# Patient Record
Sex: Female | Born: 1955 | Race: White | Hispanic: No | Marital: Married | Smoking: Former smoker
Health system: Southern US, Community
[De-identification: ages and names within clinical notes are randomized; demographics above are authoritative.]

## PROBLEM LIST (undated history)

## (undated) DIAGNOSIS — E559 Vitamin D deficiency, unspecified: Secondary | ICD-10-CM

## (undated) DIAGNOSIS — E890 Postprocedural hypothyroidism: Secondary | ICD-10-CM

## (undated) DIAGNOSIS — F329 Major depressive disorder, single episode, unspecified: Secondary | ICD-10-CM

## (undated) DIAGNOSIS — R011 Cardiac murmur, unspecified: Secondary | ICD-10-CM

## (undated) DIAGNOSIS — R7303 Prediabetes: Secondary | ICD-10-CM

## (undated) DIAGNOSIS — T4145XA Adverse effect of unspecified anesthetic, initial encounter: Secondary | ICD-10-CM

## (undated) DIAGNOSIS — Z860100 Personal history of colon polyps, unspecified: Secondary | ICD-10-CM

## (undated) DIAGNOSIS — T8859XA Other complications of anesthesia, initial encounter: Secondary | ICD-10-CM

## (undated) DIAGNOSIS — H9193 Unspecified hearing loss, bilateral: Secondary | ICD-10-CM

## (undated) DIAGNOSIS — E7800 Pure hypercholesterolemia, unspecified: Secondary | ICD-10-CM

## (undated) DIAGNOSIS — D219 Benign neoplasm of connective and other soft tissue, unspecified: Secondary | ICD-10-CM

## (undated) DIAGNOSIS — N3281 Overactive bladder: Secondary | ICD-10-CM

## (undated) DIAGNOSIS — G4700 Insomnia, unspecified: Secondary | ICD-10-CM

## (undated) DIAGNOSIS — H9190 Unspecified hearing loss, unspecified ear: Secondary | ICD-10-CM

## (undated) DIAGNOSIS — E213 Hyperparathyroidism, unspecified: Secondary | ICD-10-CM

## (undated) DIAGNOSIS — F32A Depression, unspecified: Secondary | ICD-10-CM

## (undated) DIAGNOSIS — Z889 Allergy status to unspecified drugs, medicaments and biological substances status: Secondary | ICD-10-CM

## (undated) DIAGNOSIS — IMO0001 Reserved for inherently not codable concepts without codable children: Secondary | ICD-10-CM

## (undated) HISTORY — DX: Personal history of colonic polyps: Z86.010

## (undated) HISTORY — DX: Unspecified hearing loss, bilateral: H91.93

## (undated) HISTORY — PX: EYE SURGERY: SHX253

## (undated) HISTORY — DX: Pure hypercholesterolemia, unspecified: E78.00

## (undated) HISTORY — DX: Hyperparathyroidism, unspecified: E21.3

## (undated) HISTORY — PX: OTHER SURGICAL HISTORY: SHX169

## (undated) HISTORY — PX: BROW LIFT AND BLEPHAROPLASTY: SHX1271

## (undated) HISTORY — DX: Benign neoplasm of connective and other soft tissue, unspecified: D21.9

## (undated) HISTORY — DX: Postprocedural hypothyroidism: E89.0

## (undated) HISTORY — DX: Prediabetes: R73.03

## (undated) HISTORY — DX: Unspecified hearing loss, unspecified ear: H91.90

## (undated) HISTORY — PX: COLONOSCOPY: SHX5424

## (undated) HISTORY — DX: Personal history of colon polyps, unspecified: Z86.0100

## (undated) HISTORY — DX: Vitamin D deficiency, unspecified: E55.9

## (undated) HISTORY — PX: TONSILLECTOMY: SUR1361

## (undated) HISTORY — DX: Overactive bladder: N32.81

## (undated) HISTORY — DX: Reserved for inherently not codable concepts without codable children: IMO0001

## (undated) HISTORY — PX: WISDOM TOOTH EXTRACTION: SHX21

---

## 1998-04-28 ENCOUNTER — Other Ambulatory Visit: Admission: RE | Admit: 1998-04-28 | Discharge: 1998-04-28 | Payer: Self-pay | Admitting: Obstetrics and Gynecology

## 1999-12-06 ENCOUNTER — Ambulatory Visit (HOSPITAL_COMMUNITY): Admission: RE | Admit: 1999-12-06 | Discharge: 1999-12-06 | Payer: Self-pay | Admitting: Obstetrics and Gynecology

## 1999-12-06 ENCOUNTER — Encounter: Payer: Self-pay | Admitting: Obstetrics and Gynecology

## 2000-02-08 ENCOUNTER — Other Ambulatory Visit: Admission: RE | Admit: 2000-02-08 | Discharge: 2000-02-08 | Payer: Self-pay | Admitting: Obstetrics and Gynecology

## 2002-03-20 ENCOUNTER — Other Ambulatory Visit: Admission: RE | Admit: 2002-03-20 | Discharge: 2002-03-20 | Payer: Self-pay | Admitting: Obstetrics and Gynecology

## 2002-07-25 ENCOUNTER — Ambulatory Visit (HOSPITAL_COMMUNITY): Admission: RE | Admit: 2002-07-25 | Discharge: 2002-07-25 | Payer: Self-pay | Admitting: Obstetrics and Gynecology

## 2002-07-25 ENCOUNTER — Encounter: Payer: Self-pay | Admitting: Obstetrics and Gynecology

## 2003-03-26 ENCOUNTER — Other Ambulatory Visit: Admission: RE | Admit: 2003-03-26 | Discharge: 2003-03-26 | Payer: Self-pay | Admitting: Obstetrics and Gynecology

## 2005-08-03 ENCOUNTER — Encounter: Admission: RE | Admit: 2005-08-03 | Discharge: 2005-08-03 | Payer: Self-pay | Admitting: *Deleted

## 2006-01-13 ENCOUNTER — Other Ambulatory Visit: Admission: RE | Admit: 2006-01-13 | Discharge: 2006-01-13 | Payer: Self-pay | Admitting: Obstetrics and Gynecology

## 2006-01-23 ENCOUNTER — Ambulatory Visit (HOSPITAL_COMMUNITY): Admission: RE | Admit: 2006-01-23 | Discharge: 2006-01-23 | Payer: Self-pay | Admitting: Obstetrics and Gynecology

## 2006-02-02 ENCOUNTER — Encounter: Admission: RE | Admit: 2006-02-02 | Discharge: 2006-02-02 | Payer: Self-pay | Admitting: Obstetrics and Gynecology

## 2007-02-01 ENCOUNTER — Encounter: Admission: RE | Admit: 2007-02-01 | Discharge: 2007-02-01 | Payer: Self-pay | Admitting: Obstetrics and Gynecology

## 2008-03-27 ENCOUNTER — Encounter: Admission: RE | Admit: 2008-03-27 | Discharge: 2008-03-27 | Payer: Self-pay | Admitting: Obstetrics and Gynecology

## 2009-11-19 ENCOUNTER — Encounter: Admission: RE | Admit: 2009-11-19 | Discharge: 2009-11-19 | Payer: Self-pay | Admitting: Obstetrics and Gynecology

## 2011-05-26 ENCOUNTER — Ambulatory Visit
Admission: RE | Admit: 2011-05-26 | Discharge: 2011-05-26 | Disposition: A | Discharge status: Home / Self Care | Payer: Commercial Managed Care - PPO | Source: Ambulatory Visit | Attending: Family Medicine | Admitting: Family Medicine

## 2011-05-26 ENCOUNTER — Other Ambulatory Visit: Payer: Self-pay | Admitting: Family Medicine

## 2011-05-26 DIAGNOSIS — R609 Edema, unspecified: Secondary | ICD-10-CM

## 2011-05-26 DIAGNOSIS — R52 Pain, unspecified: Secondary | ICD-10-CM

## 2011-11-25 ENCOUNTER — Other Ambulatory Visit: Payer: Self-pay | Admitting: Obstetrics and Gynecology

## 2011-11-25 DIAGNOSIS — Z1231 Encounter for screening mammogram for malignant neoplasm of breast: Secondary | ICD-10-CM

## 2011-12-06 ENCOUNTER — Ambulatory Visit
Admission: RE | Admit: 2011-12-06 | Discharge: 2011-12-06 | Disposition: A | Discharge status: Home / Self Care | Payer: Commercial Managed Care - PPO | Source: Ambulatory Visit | Attending: Obstetrics and Gynecology | Admitting: Obstetrics and Gynecology

## 2011-12-06 DIAGNOSIS — Z1231 Encounter for screening mammogram for malignant neoplasm of breast: Secondary | ICD-10-CM

## 2011-12-07 ENCOUNTER — Ambulatory Visit: Payer: Commercial Managed Care - PPO

## 2012-02-10 DIAGNOSIS — N3281 Overactive bladder: Secondary | ICD-10-CM | POA: Insufficient documentation

## 2012-02-10 DIAGNOSIS — E559 Vitamin D deficiency, unspecified: Secondary | ICD-10-CM | POA: Insufficient documentation

## 2012-02-10 DIAGNOSIS — H9190 Unspecified hearing loss, unspecified ear: Secondary | ICD-10-CM | POA: Insufficient documentation

## 2012-02-10 DIAGNOSIS — IMO0001 Reserved for inherently not codable concepts without codable children: Secondary | ICD-10-CM | POA: Insufficient documentation

## 2012-02-15 ENCOUNTER — Ambulatory Visit (INDEPENDENT_AMBULATORY_CARE_PROVIDER_SITE_OTHER): Payer: Commercial Managed Care - PPO | Admitting: Obstetrics and Gynecology

## 2012-02-15 ENCOUNTER — Encounter: Payer: Self-pay | Admitting: Obstetrics and Gynecology

## 2012-02-15 VITALS — BP 112/76 | Ht 63.0 in | Wt 175.0 lb

## 2012-02-15 DIAGNOSIS — Z01419 Encounter for gynecological examination (general) (routine) without abnormal findings: Secondary | ICD-10-CM

## 2012-02-15 NOTE — Progress Notes (Signed)
The patient is not taking hormone replacement therapy The patient  is not taking a Calcium supplement. Post-menopausal bleeding:no  Last Pap: was normal June  2012 Last mammogram: was normal May  2012 Last DEXA scan : T= 1.18 December 2010 Last colonoscopy:polyp due in 2014  Urinary symptoms: none Normal bowel movements: Yes Reports abuse at home: No:   Subjective:    [name] is a 56 y.o. female G1P1 who presents for annual exam.  The patient has no complaints today.   The following portions of the patient's history were reviewed and updated as appropriate: allergies, current medications, past family history, past medical history, past social history, past surgical history and problem list.  Review of Systems Pertinent items are noted in HPI. Gastrointestinal:No change in bowel habits, no abdominal pain, no rectal bleeding Genitourinary:negative for dysuria, frequency, hematuria, nocturia and urinary incontinence    Objective:     BP 112/76  Ht 5\' 3"  (1.6 m)  Wt 175 lb (79.379 kg)  BMI 31.00 kg/m2  LMP 07/24/2011  Weight:  Wt Readings from Last 1 Encounters:  02/15/12 175 lb (79.379 kg)     BMI: Body mass index is 31.00 kg/(m^2). General Appearance: Alert, appropriate appearance for age. No acute distress HEENT: Grossly normal Neck / Thyroid: Supple, no masses, nodes or enlargement Lungs: clear to auscultation bilaterally Back: No CVA tenderness Breast Exam: No masses or nodes.No dimpling, nipple retraction or discharge. Cardiovascular: Regular rate and rhythm. S1, S2, no murmur Gastrointestinal: Soft, non-tender, no masses or organomegaly Pelvic Exam: Vulva and vagina appear normal. Bimanual exam reveals normal uterus and adnexa. Rectovaginal: normal rectal, no masses Lymphatic Exam: Non-palpable nodes in neck, clavicular, axillary, or inguinal regions Skin: no rash or abnormalities Neurologic: Normal gait and speech, no tremor  Psychiatric: Alert and oriented,  appropriate affect.       Assessment:    Normal gyn exam    Plan:    Pap, mammogram   Follow-up:  for annual exam

## 2012-02-16 LAB — PAP IG W/ RFLX HPV ASCU

## 2012-11-21 ENCOUNTER — Other Ambulatory Visit: Payer: Self-pay | Admitting: Obstetrics and Gynecology

## 2012-11-21 ENCOUNTER — Other Ambulatory Visit: Payer: Self-pay

## 2012-11-21 DIAGNOSIS — Z1231 Encounter for screening mammogram for malignant neoplasm of breast: Secondary | ICD-10-CM

## 2012-12-24 ENCOUNTER — Ambulatory Visit
Admission: RE | Admit: 2012-12-24 | Discharge: 2012-12-24 | Disposition: A | Discharge status: Home / Self Care | Payer: Commercial Managed Care - PPO | Source: Ambulatory Visit

## 2012-12-24 DIAGNOSIS — Z1231 Encounter for screening mammogram for malignant neoplasm of breast: Secondary | ICD-10-CM

## 2014-05-12 ENCOUNTER — Encounter: Payer: Self-pay | Admitting: Obstetrics and Gynecology

## 2014-06-16 ENCOUNTER — Other Ambulatory Visit: Payer: Self-pay

## 2014-06-16 DIAGNOSIS — Z1231 Encounter for screening mammogram for malignant neoplasm of breast: Secondary | ICD-10-CM

## 2014-07-08 ENCOUNTER — Ambulatory Visit
Admission: RE | Admit: 2014-07-08 | Discharge: 2014-07-08 | Disposition: A | Discharge status: Home / Self Care | Payer: Commercial Managed Care - PPO | Source: Ambulatory Visit

## 2014-07-08 DIAGNOSIS — Z1231 Encounter for screening mammogram for malignant neoplasm of breast: Secondary | ICD-10-CM

## 2014-10-08 ENCOUNTER — Other Ambulatory Visit: Payer: Self-pay | Admitting: Otolaryngology

## 2014-10-08 DIAGNOSIS — H905 Unspecified sensorineural hearing loss: Secondary | ICD-10-CM

## 2014-10-30 ENCOUNTER — Ambulatory Visit
Admission: RE | Admit: 2014-10-30 | Discharge: 2014-10-30 | Disposition: A | Discharge status: Home / Self Care | Payer: Commercial Managed Care - PPO | Source: Ambulatory Visit | Attending: Otolaryngology | Admitting: Otolaryngology

## 2014-10-30 DIAGNOSIS — H905 Unspecified sensorineural hearing loss: Secondary | ICD-10-CM

## 2014-10-30 MED ORDER — IOPAMIDOL (ISOVUE-300) INJECTION 61%
75.0000 mL | Freq: Once | INTRAVENOUS | Status: AC | PRN
  Administered 2014-10-30: 75 mL via INTRAVENOUS

## 2014-12-15 ENCOUNTER — Ambulatory Visit
Admission: RE | Admit: 2014-12-15 | Discharge: 2014-12-15 | Disposition: A | Discharge status: Home / Self Care | Payer: Commercial Managed Care - PPO | Source: Ambulatory Visit | Attending: Family Medicine | Admitting: Family Medicine

## 2014-12-15 ENCOUNTER — Other Ambulatory Visit: Payer: Self-pay | Admitting: Family Medicine

## 2014-12-15 DIAGNOSIS — Z01818 Encounter for other preprocedural examination: Secondary | ICD-10-CM

## 2015-03-24 ENCOUNTER — Other Ambulatory Visit (HOSPITAL_COMMUNITY): Payer: Self-pay | Admitting: Endocrinology

## 2015-03-24 DIAGNOSIS — E0789 Other specified disorders of thyroid: Secondary | ICD-10-CM

## 2015-03-24 DIAGNOSIS — E213 Hyperparathyroidism, unspecified: Secondary | ICD-10-CM

## 2015-03-31 ENCOUNTER — Ambulatory Visit (HOSPITAL_COMMUNITY)
Admission: RE | Admit: 2015-03-31 | Discharge: 2015-03-31 | Disposition: A | Discharge status: Home / Self Care | Payer: Commercial Managed Care - PPO | Source: Ambulatory Visit | Attending: Endocrinology | Admitting: Endocrinology

## 2015-03-31 DIAGNOSIS — E049 Nontoxic goiter, unspecified: Secondary | ICD-10-CM | POA: Diagnosis present

## 2015-03-31 DIAGNOSIS — E0789 Other specified disorders of thyroid: Secondary | ICD-10-CM

## 2015-03-31 DIAGNOSIS — E042 Nontoxic multinodular goiter: Secondary | ICD-10-CM | POA: Insufficient documentation

## 2015-04-03 ENCOUNTER — Other Ambulatory Visit: Payer: Self-pay | Admitting: Endocrinology

## 2015-04-03 DIAGNOSIS — E041 Nontoxic single thyroid nodule: Secondary | ICD-10-CM

## 2015-04-07 ENCOUNTER — Ambulatory Visit
Admission: RE | Admit: 2015-04-07 | Discharge: 2015-04-07 | Disposition: A | Discharge status: Home / Self Care | Payer: Commercial Managed Care - PPO | Source: Ambulatory Visit | Attending: Endocrinology | Admitting: Endocrinology

## 2015-04-07 ENCOUNTER — Other Ambulatory Visit (HOSPITAL_COMMUNITY)
Admission: RE | Admit: 2015-04-07 | Discharge: 2015-04-07 | Disposition: A | Discharge status: Home / Self Care | Payer: Commercial Managed Care - PPO | Source: Ambulatory Visit | Attending: Physician Assistant | Admitting: Physician Assistant

## 2015-04-07 DIAGNOSIS — E042 Nontoxic multinodular goiter: Secondary | ICD-10-CM | POA: Insufficient documentation

## 2015-04-07 DIAGNOSIS — E041 Nontoxic single thyroid nodule: Secondary | ICD-10-CM

## 2015-04-07 NOTE — Procedures (Signed)
Using direct ultrasound guidance, 3 passes were made using needles into each nodule within the right and left lobe of the thyroid.   Ultrasound was used to confirm needle placements on all occasions.   Specimens were sent to Pathology for analysis.  WENDY S BLAIR PA-C 04/07/2015 10:25 AM

## 2015-04-08 ENCOUNTER — Encounter (HOSPITAL_COMMUNITY)
Admission: RE | Admit: 2015-04-08 | Discharge: 2015-04-08 | Disposition: A | Discharge status: Home / Self Care | Payer: Commercial Managed Care - PPO | Source: Ambulatory Visit | Attending: Endocrinology | Admitting: Endocrinology

## 2015-04-08 ENCOUNTER — Encounter (HOSPITAL_COMMUNITY): Payer: Commercial Managed Care - PPO

## 2015-04-08 MED ORDER — TECHNETIUM TC 99M TETROFOSMIN IV KIT
26.0000 | PACK | Freq: Once | INTRAVENOUS | Status: AC | PRN
  Administered 2015-04-08: 26 via INTRAVENOUS

## 2015-04-23 ENCOUNTER — Other Ambulatory Visit: Payer: Self-pay | Admitting: Endocrinology

## 2015-04-23 DIAGNOSIS — E049 Nontoxic goiter, unspecified: Secondary | ICD-10-CM

## 2015-05-20 ENCOUNTER — Ambulatory Visit: Payer: Self-pay | Admitting: Surgery

## 2015-06-11 DIAGNOSIS — E890 Postprocedural hypothyroidism: Secondary | ICD-10-CM

## 2015-06-11 DIAGNOSIS — E213 Hyperparathyroidism, unspecified: Secondary | ICD-10-CM

## 2015-06-11 HISTORY — DX: Hyperparathyroidism, unspecified: E21.3

## 2015-06-11 HISTORY — DX: Postprocedural hypothyroidism: E89.0

## 2015-06-16 ENCOUNTER — Encounter (HOSPITAL_COMMUNITY): Payer: Self-pay

## 2015-06-16 ENCOUNTER — Encounter (HOSPITAL_COMMUNITY)
Admission: RE | Admit: 2015-06-16 | Discharge: 2015-06-16 | Disposition: A | Discharge status: Home / Self Care | Payer: Commercial Managed Care - PPO | Source: Ambulatory Visit | Attending: Surgery | Admitting: Surgery

## 2015-06-16 DIAGNOSIS — E042 Nontoxic multinodular goiter: Secondary | ICD-10-CM | POA: Diagnosis not present

## 2015-06-16 DIAGNOSIS — M8580 Other specified disorders of bone density and structure, unspecified site: Secondary | ICD-10-CM | POA: Diagnosis not present

## 2015-06-16 DIAGNOSIS — Z79899 Other long term (current) drug therapy: Secondary | ICD-10-CM | POA: Diagnosis not present

## 2015-06-16 DIAGNOSIS — R011 Cardiac murmur, unspecified: Secondary | ICD-10-CM | POA: Diagnosis not present

## 2015-06-16 DIAGNOSIS — F329 Major depressive disorder, single episode, unspecified: Secondary | ICD-10-CM | POA: Diagnosis not present

## 2015-06-16 DIAGNOSIS — E559 Vitamin D deficiency, unspecified: Secondary | ICD-10-CM | POA: Diagnosis not present

## 2015-06-16 DIAGNOSIS — E210 Primary hyperparathyroidism: Secondary | ICD-10-CM | POA: Diagnosis present

## 2015-06-16 DIAGNOSIS — I10 Essential (primary) hypertension: Secondary | ICD-10-CM | POA: Diagnosis not present

## 2015-06-16 DIAGNOSIS — Z87891 Personal history of nicotine dependence: Secondary | ICD-10-CM | POA: Diagnosis not present

## 2015-06-16 DIAGNOSIS — E7800 Pure hypercholesterolemia, unspecified: Secondary | ICD-10-CM | POA: Diagnosis not present

## 2015-06-16 HISTORY — DX: Depression, unspecified: F32.A

## 2015-06-16 HISTORY — DX: Insomnia, unspecified: G47.00

## 2015-06-16 HISTORY — DX: Cardiac murmur, unspecified: R01.1

## 2015-06-16 HISTORY — DX: Major depressive disorder, single episode, unspecified: F32.9

## 2015-06-16 HISTORY — DX: Allergy status to unspecified drugs, medicaments and biological substances: Z88.9

## 2015-06-16 HISTORY — DX: Hypercalcemia: E83.52

## 2015-06-16 LAB — CBC
HEMATOCRIT: 44 % (ref 36.0–46.0)
Hemoglobin: 14.8 g/dL (ref 12.0–15.0)
MCH: 30 pg (ref 26.0–34.0)
MCHC: 33.6 g/dL (ref 30.0–36.0)
MCV: 89.2 fL (ref 78.0–100.0)
Platelets: 314 10*3/uL (ref 150–400)
RBC: 4.93 MIL/uL (ref 3.87–5.11)
RDW: 12.9 % (ref 11.5–15.5)
WBC: 6.8 10*3/uL (ref 4.0–10.5)

## 2015-06-16 NOTE — Patient Instructions (Addendum)
[place]  06/16/2015   Your procedure is scheduled on:   06-18-2015 Thursday  Enter through [place] and follow signs to [facility] to [place]. Arrive at  0530      AM .  (Limit 1 person with you).  Call this number if you have problems the morning of surgery: [phone]  Or Presurgical Testing [phone] days before.   For Living Will and/or Health Care Power Attorney Forms: please provide copy for your medical record,may bring AM of surgery(Forms should be already notarized -we do not provide this service).(06-16-15 Yes-to bring AM of.).      Do not eat food/ or drink: After Midnight.     Take these medicines the morning of surgery with A SIP OF WATER-   (DO NOT TAKE ANY DIABETIC MEDS AM OF SURGERY) : NONE.   Do not wear jewelry, make-up or nail polish.  Do not wear deodorant, lotions, powders, or perfumes.   Do not shave legs and under arms- 48 hours(2 days) prior to first CHG shower.(Shaving face and neck okay.)  Do not bring valuables to the hospital.(Hospital is not responsible for lost valuables).  Contacts, dentures or removable bridgework, body piercing, hair pins may not be worn into surgery.  Leave suitcase in the car. After surgery it may be brought to your room.  For patients admitted to the hospital, checkout time is 11:00 AM the day of discharge.(Restricted visitors-Any Persons displaying flu-like symptoms or illness).    Patients discharged the day of surgery will not be allowed to drive home. Must have responsible person with you x 24 hours once discharged.  Name and phone number of your driver: Roger-spouse [phone] cell     Please read over the following fact sheets that you were given:  CHG(Chlorhexidine Gluconate 4% Surgical Soap) use.  Remember : Type/Screen "Blue armbands" - may not be removed once applied(would result in being retested AM of surgery, if removed).         [facility] - Preparing  for Surgery Before surgery, you can play an important role.  Because skin is not sterile, your skin needs to be as free of germs as possible.  You can reduce the number of germs on your skin by washing with CHG (chlorahexidine gluconate) soap before surgery.  CHG is an antiseptic cleaner which kills germs and bonds with the skin to continue killing germs even after washing. Please DO NOT use if you have an allergy to CHG or antibacterial soaps.  If your skin becomes reddened/irritated stop using the CHG and inform your nurse when you arrive at Short Stay. Do not shave (including legs and underarms) for at least 48 hours prior to the first CHG shower.  You may shave your face/neck. Please follow these instructions carefully:  1.  Shower with CHG Soap the night before surgery and the  morning of Surgery.  2.  If you choose to wash your hair, wash your hair first as usual with your  normal  shampoo.  3.  After you shampoo, rinse your hair and body thoroughly to remove the  shampoo.                           4.  Use CHG as you would any other liquid soap.  You can apply chg directly  to the skin and wash  Gently with a scrungie or clean washcloth.  5.  Apply the CHG Soap to your body ONLY FROM THE NECK DOWN.   Do not use on face/ open                           Wound or open sores. Avoid contact with eyes, ears mouth and genitals (private parts).                       Wash face,  Genitals (private parts) with your normal soap.             6.  Wash thoroughly, paying special attention to the area where your surgery  will be performed.  7.  Thoroughly rinse your body with warm water from the neck down.  8.  DO NOT shower/wash with your normal soap after using and rinsing off  the CHG Soap.                9.  Pat yourself dry with a clean towel.            10.  Wear clean pajamas.            11.  Place clean sheets on your bed the night of your first shower and do not  sleep with  pets. Day of Surgery : Do not apply any lotions/deodorants the morning of surgery.  Please wear clean clothes to the hospital/surgery center.  FAILURE TO FOLLOW THESE INSTRUCTIONS MAY RESULT IN THE CANCELLATION OF YOUR SURGERY PATIENT SIGNATURE_________________________________  NURSE SIGNATURE__________________________________  ________________________________________________________________________

## 2015-06-17 ENCOUNTER — Encounter (HOSPITAL_COMMUNITY): Payer: Self-pay | Admitting: Surgery

## 2015-06-17 DIAGNOSIS — E210 Primary hyperparathyroidism: Secondary | ICD-10-CM | POA: Diagnosis present

## 2015-06-17 NOTE — H&P (Signed)
General Surgery [facility]  [name] DOB: 1956/01/17 Married / Language: English / Race: White Female  History of Present Illness The patient is a 59 year old female who presents with a parathyroid neoplasm.  Patient is referred by Dr. Jacelyn Pi for evaluation of primary hyperparathyroidism. Patient has a long-standing history of hypercalcemia. She has been treated for a number of years for vitamin D deficiency. She has had multiple bone density scans which documented osteopenia. Patient denies any other complications such as nephrolithiasis. She does have significant chronic fatigue. Unrelated to her parathyroid disease, the patient has significant hearing loss and wears bilateral hearing aids. She is considering cochlear implant surgery. Patient was referred by her primary care physician, Dr. Darcus Austin, to endocrinology for evaluation of hypercalcemia. Patient was also found to have bilateral thyroid nodules and underwent bilateral fine-needle aspiration biopsy with benign cytopathology. Patient had recent laboratory studies demonstrating an elevated calcium at 10.5 and an elevated intact PTH level at 74 with a normal vitamin D level of 32.6. Patient underwent nuclear medicine parathyroid scan on April 08, 2015. This showed a persistent focus of increased uptake at the right inferior thyroid lobe consistent with parathyroid adenoma. Sensitivity of this study may be limited by the presence of bilateral thyroid nodules. There was no corresponding anatomic lesion seen on thyroid ultrasound. Patient has had no prior head or neck surgery. There is a family history of possible parathyroid disease in the patient's mother at advanced age, but no operative intervention was performed. There is no other family history of endocrine neoplasms.  Other Problems Back Pain Depression Heart murmur High blood pressure Hypercholesterolemia  Past  Surgical History  Colon Polyp Removal - Colonoscopy Colon Polyp Removal - Open Oral Surgery Tonsillectomy  Diagnostic Studies History  Colonoscopy within last year Mammogram within last year Pap Smear 1-5 years ago  Allergies No Known Drug Allergies11/03/2015  Medication History Eszopiclone (3MG  Tablet, Oral) Active. Sertraline HCl (50MG  Tablet, Oral) Active. Vitamin D (Ergocalciferol) (50000UNIT Capsule, Oral two times a week) Active. Medications Reconciled  Social History Alcohol use Occasional alcohol use. Caffeine use Coffee, Tea. No drug use Tobacco use Former smoker.  Family History Alcohol Abuse Father, Sister. Breast Cancer Sister. Depression Mother, Sister. Diabetes Mellitus Mother. Hypertension Mother, Sister. Migraine Headache Mother.  Pregnancy / Birth History Age at menarche 59 years. Age of menopause 34-55 Gravida 1 Maternal age 19-30 Para 1  Review of Systems General Present- Fatigue and Weight Gain. Not Present- Appetite Loss, Chills, Fever, Night Sweats and Weight Loss. Skin Not Present- Change in Wart/Mole, Dryness, Hives, Jaundice, New Lesions, Non-Healing Wounds, Rash and Ulcer. HEENT Present- Hearing Loss. Not Present- Earache, Hoarseness, Nose Bleed, Oral Ulcers, Ringing in the Ears, Seasonal Allergies, Sinus Pain, Sore Throat, Visual Disturbances, Wears glasses/contact lenses and Yellow Eyes. Respiratory Present- Snoring. Not Present- Bloody sputum, Chronic Cough, Difficulty Breathing and Wheezing. Breast Not Present- Breast Mass, Breast Pain, Nipple Discharge and Skin Changes. Cardiovascular Present- Shortness of Breath and Swelling of Extremities. Not Present- Chest Pain, Difficulty Breathing Lying Down, Leg Cramps, Palpitations and Rapid Heart Rate. Gastrointestinal Not Present- Abdominal Pain, Bloating, Bloody Stool, Change in Bowel Habits, Chronic diarrhea, Constipation, Difficulty Swallowing, Excessive gas, Gets  full quickly at meals, Hemorrhoids, Indigestion, Nausea, Rectal Pain and Vomiting. Female Genitourinary Present- Urgency. Not Present- Frequency, Nocturia, Painful Urination and Pelvic Pain. Musculoskeletal Present- Back Pain, Joint Pain and Joint Stiffness. Not Present- Muscle Pain, Muscle Weakness and Swelling of Extremities. Neurological Present- Decreased  Memory. Not Present- Fainting, Headaches, Numbness, Seizures, Tingling, Tremor, Trouble walking and Weakness. Psychiatric Present- Depression. Not Present- Anxiety, Bipolar, Change in Sleep Pattern, Fearful and Frequent crying. Endocrine Present- Cold Intolerance, Excessive Hunger and Hair Changes. Not Present- Heat Intolerance, Hot flashes and New Diabetes. Hematology Not Present- Easy Bruising, Excessive bleeding, Gland problems, HIV and Persistent Infections.  Vitals Weight: 193 lb Height: 63in Body Surface Area: 1.9 m Body Mass Index: 34.19 kg/m  Temp.: 97.38F(Temporal)  Pulse: 81 (Regular)  BP: 128/72 (Sitting, Left Arm, Standard)  Physical Exam   General - appears comfortable, no distress; not diaphorectic  HEENT - normocephalic; sclerae clear, gaze conjugate; mucous membranes moist, dentition good; voice normal  Neck - asymmetric on extension; no palpable anterior or posterior cervical adenopathy; bilateral palpable thyroid nodules, 2-3 cm in size, smooth, mobile, nontender  Chest - clear bilaterally without rhonchi, rales, or wheeze  Cor - regular rhythm with normal rate; no significant murmur  Ext - non-tender without significant edema or lymphedema  Neuro - grossly intact; no tremor   Assessment & Plan  MULTIPLE THYROID NODULES (E04.2)  PRIMARY HYPERPARATHYROIDISM (E21.0)  Patient presents with signs and symptoms of primary hyperparathyroidism. Nuclear medicine parathyroid scan localizes a parathyroid adenoma to the right inferior position. Patient is provided with written literature on parathyroid  surgery to review at home.  I have recommended proceeding with minimally invasive parathyroidectomy targeting the right inferior parathyroid gland. However, I have cautioned the patient that the nuclear medicine scan may be somewhat less reliable due to the presence of bilateral thyroid nodules. We have discussed the possibility of conversion to 4 gland exploration requiring an overnight hospitalization. We discussed the risk and benefits of the procedure including the potential for recurrent laryngeal nerve injury. Patient understands and wishes to proceed with surgery in the near future.  The risks and benefits of the procedure have been discussed at length with the patient. The patient understands the proposed procedure, potential alternative treatments, and the course of recovery to be expected. All of the patient's questions have been answered at this time. The patient wishes to proceed with surgery.  Earnstine Regal, MD, [facility] Office: [phone]

## 2015-06-17 NOTE — Anesthesia Preprocedure Evaluation (Signed)
Anesthesia Evaluation  Patient identified by MRN, date of birth, ID band Patient awake    Reviewed: Allergy & Precautions, H&P , NPO status , Patient's Chart, lab work & pertinent test results  Airway Mallampati: II  TM Distance: >3 FB Neck ROM: full    Dental no notable dental hx. (+) Dental Advisory Given, Teeth Intact   Pulmonary neg pulmonary ROS, former smoker,    Pulmonary exam normal breath sounds clear to auscultation       Cardiovascular Exercise Tolerance: Good negative cardio ROS Normal cardiovascular exam Rhythm:regular Rate:Normal     Neuro/Psych negative neurological ROS  negative psych ROS   GI/Hepatic negative GI ROS, Neg liver ROS,   Endo/Other  negative endocrine ROS  Renal/GU negative Renal ROS  negative genitourinary   Musculoskeletal   Abdominal   Peds  Hematology negative hematology ROS (+)   Anesthesia Other Findings   Reproductive/Obstetrics negative OB ROS                             Anesthesia Physical Anesthesia Plan  ASA: II  Anesthesia Plan: General   Post-op Pain Management:    Induction: Intravenous  Airway Management Planned: Oral ETT  Additional Equipment:   Intra-op Plan:   Post-operative Plan: Extubation in OR  Informed Consent: I have reviewed the patients History and Physical, chart, labs and discussed the procedure including the risks, benefits and alternatives for the proposed anesthesia with the patient or authorized representative who has indicated his/her understanding and acceptance.   Dental Advisory Given  Plan Discussed with: CRNA and Surgeon  Anesthesia Plan Comments:         Anesthesia Quick Evaluation

## 2015-06-18 ENCOUNTER — Ambulatory Visit (HOSPITAL_COMMUNITY): Payer: Commercial Managed Care - PPO | Admitting: Anesthesiology

## 2015-06-18 ENCOUNTER — Encounter (HOSPITAL_COMMUNITY)
Admission: RE | Disposition: A | Discharge status: Home / Self Care | Payer: Self-pay | Source: Ambulatory Visit | Attending: Surgery

## 2015-06-18 ENCOUNTER — Observation Stay (HOSPITAL_COMMUNITY)
Admission: RE | Admit: 2015-06-18 | Discharge: 2015-06-19 | Disposition: A | Discharge status: Home / Self Care | Payer: Commercial Managed Care - PPO | Source: Ambulatory Visit | Attending: Surgery | Admitting: Surgery

## 2015-06-18 ENCOUNTER — Encounter (HOSPITAL_COMMUNITY): Payer: Self-pay | Admitting: *Deleted

## 2015-06-18 DIAGNOSIS — E7800 Pure hypercholesterolemia, unspecified: Secondary | ICD-10-CM | POA: Insufficient documentation

## 2015-06-18 DIAGNOSIS — R011 Cardiac murmur, unspecified: Secondary | ICD-10-CM | POA: Insufficient documentation

## 2015-06-18 DIAGNOSIS — Z79899 Other long term (current) drug therapy: Secondary | ICD-10-CM | POA: Insufficient documentation

## 2015-06-18 DIAGNOSIS — E041 Nontoxic single thyroid nodule: Secondary | ICD-10-CM | POA: Diagnosis present

## 2015-06-18 DIAGNOSIS — E042 Nontoxic multinodular goiter: Secondary | ICD-10-CM | POA: Insufficient documentation

## 2015-06-18 DIAGNOSIS — M8580 Other specified disorders of bone density and structure, unspecified site: Secondary | ICD-10-CM | POA: Insufficient documentation

## 2015-06-18 DIAGNOSIS — E210 Primary hyperparathyroidism: Principal | ICD-10-CM | POA: Diagnosis present

## 2015-06-18 DIAGNOSIS — E559 Vitamin D deficiency, unspecified: Secondary | ICD-10-CM | POA: Insufficient documentation

## 2015-06-18 DIAGNOSIS — I10 Essential (primary) hypertension: Secondary | ICD-10-CM | POA: Insufficient documentation

## 2015-06-18 DIAGNOSIS — F329 Major depressive disorder, single episode, unspecified: Secondary | ICD-10-CM | POA: Insufficient documentation

## 2015-06-18 DIAGNOSIS — Z87891 Personal history of nicotine dependence: Secondary | ICD-10-CM | POA: Insufficient documentation

## 2015-06-18 HISTORY — PX: PARATHYROIDECTOMY: SHX19

## 2015-06-18 HISTORY — PX: THYROIDECTOMY: SHX17

## 2015-06-18 SURGERY — PARATHYROIDECTOMY
Anesthesia: General | Site: Neck | Laterality: Right

## 2015-06-18 MED ORDER — HYDROMORPHONE HCL 1 MG/ML IJ SOLN
1.0000 mg | INTRAMUSCULAR | Status: DC | PRN
  Administered 2015-06-18 (×2): 1 mg via INTRAVENOUS
  Filled 2015-06-18 (×2): qty 1

## 2015-06-18 MED ORDER — ONDANSETRON HCL 4 MG/2ML IJ SOLN
INTRAMUSCULAR | Status: DC | PRN
  Administered 2015-06-18: 4 mg via INTRAVENOUS

## 2015-06-18 MED ORDER — PROPOFOL 10 MG/ML IV BOLUS
INTRAVENOUS | Status: DC | PRN
  Administered 2015-06-18: 170 mg via INTRAVENOUS
  Administered 2015-06-18: 30 mg via INTRAVENOUS

## 2015-06-18 MED ORDER — ACETAMINOPHEN 650 MG RE SUPP: 650.0000 mg | Freq: Four times a day (QID) | RECTAL | Status: DC | PRN

## 2015-06-18 MED ORDER — LACTATED RINGERS IV SOLN
INTRAVENOUS | Status: DC | PRN
  Administered 2015-06-18 (×2): via INTRAVENOUS

## 2015-06-18 MED ORDER — ACETAMINOPHEN 10 MG/ML IV SOLN
INTRAVENOUS | Status: AC
  Filled 2015-06-18: qty 100

## 2015-06-18 MED ORDER — LIDOCAINE HCL (CARDIAC) 20 MG/ML IV SOLN
INTRAVENOUS | Status: DC | PRN
  Administered 2015-06-18: 25 mg via INTRATRACHEAL
  Administered 2015-06-18: 75 mg via INTRAVENOUS

## 2015-06-18 MED ORDER — HYDRALAZINE HCL 20 MG/ML IJ SOLN
INTRAMUSCULAR | Status: DC | PRN
  Administered 2015-06-18: 5 mg via INTRAVENOUS

## 2015-06-18 MED ORDER — BUPIVACAINE-EPINEPHRINE (PF) 0.25% -1:200000 IJ SOLN
INTRAMUSCULAR | Status: AC
  Filled 2015-06-18: qty 30

## 2015-06-18 MED ORDER — ROCURONIUM BROMIDE 100 MG/10ML IV SOLN
INTRAVENOUS | Status: AC
  Filled 2015-06-18: qty 1

## 2015-06-18 MED ORDER — CEFAZOLIN SODIUM-DEXTROSE 2-3 GM-% IV SOLR
2.0000 g | INTRAVENOUS | Status: AC
  Administered 2015-06-18: 2 g via INTRAVENOUS

## 2015-06-18 MED ORDER — 0.9 % SODIUM CHLORIDE (POUR BTL) OPTIME
TOPICAL | Status: DC | PRN
  Administered 2015-06-18: 1000 mL

## 2015-06-18 MED ORDER — SUCCINYLCHOLINE CHLORIDE 20 MG/ML IJ SOLN
INTRAMUSCULAR | Status: DC | PRN
  Administered 2015-06-18: 80 mg via INTRAVENOUS

## 2015-06-18 MED ORDER — HYDROCODONE-ACETAMINOPHEN 5-325 MG PO TABS
1.0000 | ORAL_TABLET | ORAL | Status: DC | PRN
  Administered 2015-06-19: 1 via ORAL
  Filled 2015-06-18: qty 1

## 2015-06-18 MED ORDER — SERTRALINE HCL 50 MG PO TABS
50.0000 mg | ORAL_TABLET | Freq: Every day | ORAL | Status: DC
  Administered 2015-06-18: 50 mg via ORAL
  Filled 2015-06-18 (×3): qty 1

## 2015-06-18 MED ORDER — MIDAZOLAM HCL 5 MG/5ML IJ SOLN
INTRAMUSCULAR | Status: DC | PRN
  Administered 2015-06-18: 2 mg via INTRAVENOUS

## 2015-06-18 MED ORDER — GLYCOPYRROLATE 0.2 MG/ML IJ SOLN
INTRAMUSCULAR | Status: DC | PRN
  Administered 2015-06-18: 0.4 mg via INTRAVENOUS

## 2015-06-18 MED ORDER — GLYCOPYRROLATE 0.2 MG/ML IJ SOLN
INTRAMUSCULAR | Status: AC
  Filled 2015-06-18: qty 3

## 2015-06-18 MED ORDER — ONDANSETRON HCL 4 MG/2ML IJ SOLN
4.0000 mg | Freq: Four times a day (QID) | INTRAMUSCULAR | Status: DC | PRN
  Administered 2015-06-18: 4 mg via INTRAVENOUS
  Filled 2015-06-18: qty 2

## 2015-06-18 MED ORDER — ONDANSETRON HCL 4 MG/2ML IJ SOLN
INTRAMUSCULAR | Status: AC
  Filled 2015-06-18: qty 2

## 2015-06-18 MED ORDER — MIDAZOLAM HCL 2 MG/2ML IJ SOLN
INTRAMUSCULAR | Status: AC
  Filled 2015-06-18: qty 2

## 2015-06-18 MED ORDER — ACETAMINOPHEN 10 MG/ML IV SOLN
INTRAVENOUS | Status: DC | PRN
  Administered 2015-06-18: 1000 mg via INTRAVENOUS

## 2015-06-18 MED ORDER — FENTANYL CITRATE (PF) 250 MCG/5ML IJ SOLN
INTRAMUSCULAR | Status: AC
  Filled 2015-06-18: qty 5

## 2015-06-18 MED ORDER — DEXAMETHASONE SODIUM PHOSPHATE 10 MG/ML IJ SOLN
INTRAMUSCULAR | Status: AC
  Filled 2015-06-18: qty 1

## 2015-06-18 MED ORDER — ZOLPIDEM TARTRATE 5 MG PO TABS
5.0000 mg | ORAL_TABLET | Freq: Every evening | ORAL | Status: DC | PRN
  Administered 2015-06-19: 5 mg via ORAL
  Filled 2015-06-18: qty 1

## 2015-06-18 MED ORDER — ACETAMINOPHEN 325 MG PO TABS
650.0000 mg | ORAL_TABLET | Freq: Four times a day (QID) | ORAL | Status: DC | PRN
  Administered 2015-06-18: 650 mg via ORAL
  Filled 2015-06-18: qty 2

## 2015-06-18 MED ORDER — DEXAMETHASONE SODIUM PHOSPHATE 10 MG/ML IJ SOLN
INTRAMUSCULAR | Status: DC | PRN
  Administered 2015-06-18 (×2): 5 mg via INTRAVENOUS

## 2015-06-18 MED ORDER — NEOSTIGMINE METHYLSULFATE 10 MG/10ML IV SOLN
INTRAVENOUS | Status: AC
  Filled 2015-06-18: qty 1

## 2015-06-18 MED ORDER — ONDANSETRON 4 MG PO TBDP: 4.0000 mg | ORAL_TABLET | Freq: Four times a day (QID) | ORAL | Status: DC | PRN

## 2015-06-18 MED ORDER — NEOSTIGMINE METHYLSULFATE 10 MG/10ML IV SOLN
INTRAVENOUS | Status: DC | PRN
  Administered 2015-06-18: 3 mg via INTRAVENOUS

## 2015-06-18 MED ORDER — PROPOFOL 10 MG/ML IV BOLUS
INTRAVENOUS | Status: AC
  Filled 2015-06-18: qty 20

## 2015-06-18 MED ORDER — CEFAZOLIN SODIUM-DEXTROSE 2-3 GM-% IV SOLR
INTRAVENOUS | Status: AC
  Filled 2015-06-18: qty 50

## 2015-06-18 MED ORDER — KCL IN DEXTROSE-NACL 20-5-0.45 MEQ/L-%-% IV SOLN
INTRAVENOUS | Status: DC
  Administered 2015-06-18: 11:00:00 via INTRAVENOUS
  Filled 2015-06-18 (×2): qty 1000

## 2015-06-18 MED ORDER — ROCURONIUM BROMIDE 100 MG/10ML IV SOLN
INTRAVENOUS | Status: DC | PRN
  Administered 2015-06-18: 35 mg via INTRAVENOUS
  Administered 2015-06-18: 5 mg via INTRAVENOUS

## 2015-06-18 MED ORDER — FENTANYL CITRATE (PF) 100 MCG/2ML IJ SOLN
INTRAMUSCULAR | Status: DC | PRN
  Administered 2015-06-18 (×2): 100 ug via INTRAVENOUS
  Administered 2015-06-18 (×4): 50 ug via INTRAVENOUS

## 2015-06-18 MED ORDER — HYDROMORPHONE HCL 1 MG/ML IJ SOLN: 0.2500 mg | INTRAMUSCULAR | Status: DC | PRN

## 2015-06-18 MED ORDER — LIDOCAINE HCL (CARDIAC) 20 MG/ML IV SOLN
INTRAVENOUS | Status: AC
  Filled 2015-06-18: qty 5

## 2015-06-18 MED ORDER — LACTATED RINGERS IV SOLN: INTRAVENOUS | Status: DC

## 2015-06-18 SURGICAL SUPPLY — 37 items
APL SKNCLS STERI-STRIP NONHPOA (GAUZE/BANDAGES/DRESSINGS) ×1
ATTRACTOMAT 16X20 MAGNETIC DRP (DRAPES) ×2 IMPLANT
BENZOIN TINCTURE PRP APPL 2/3 (GAUZE/BANDAGES/DRESSINGS) ×1 IMPLANT
BLADE HEX COATED 2.75 (ELECTRODE) ×2 IMPLANT
BLADE SURG 15 STRL LF DISP TIS (BLADE) ×1 IMPLANT
BLADE SURG 15 STRL SS (BLADE) ×2
CHLORAPREP W/TINT 26ML (MISCELLANEOUS) ×2 IMPLANT
CLIP TI MEDIUM 6 (CLIP) ×4 IMPLANT
CLIP TI WIDE RED SMALL 6 (CLIP) ×6 IMPLANT
COVER SURGICAL LIGHT HANDLE (MISCELLANEOUS) ×1 IMPLANT
DRAPE LAPAROTOMY T 98X78 PEDS (DRAPES) ×2 IMPLANT
DRESSING SURGICEL FIBRLLR 1X2 (HEMOSTASIS) ×1 IMPLANT
DRSG SURGICEL FIBRILLAR 1X2 (HEMOSTASIS) ×2
ELECT PENCIL ROCKER SW 15FT (MISCELLANEOUS) ×2 IMPLANT
ELECT REM PT RETURN 9FT ADLT (ELECTROSURGICAL) ×2
ELECTRODE REM PT RTRN 9FT ADLT (ELECTROSURGICAL) ×1 IMPLANT
GAUZE SPONGE 4X4 16PLY XRAY LF (GAUZE/BANDAGES/DRESSINGS) ×2 IMPLANT
GLOVE SURG ORTHO 8.0 STRL STRW (GLOVE) ×2 IMPLANT
GOWN STRL REUS W/TWL XL LVL3 (GOWN DISPOSABLE) ×6 IMPLANT
KIT BASIN OR (CUSTOM PROCEDURE TRAY) ×2 IMPLANT
LIQUID BAND (GAUZE/BANDAGES/DRESSINGS) IMPLANT
NEEDLE HYPO 25X1 1.5 SAFETY (NEEDLE) ×2 IMPLANT
PACK BASIC VI WITH GOWN DISP (CUSTOM PROCEDURE TRAY) ×2 IMPLANT
SHEARS HARMONIC 9CM CVD (BLADE) ×2 IMPLANT
STAPLER VISISTAT 35W (STAPLE) ×2 IMPLANT
STRIP CLOSURE SKIN 1/2X4 (GAUZE/BANDAGES/DRESSINGS) ×1 IMPLANT
SUT MNCRL AB 4-0 PS2 18 (SUTURE) ×2 IMPLANT
SUT SILK 2 0 (SUTURE)
SUT SILK 2-0 18XBRD TIE 12 (SUTURE) IMPLANT
SUT SILK 3 0 (SUTURE)
SUT SILK 3-0 18XBRD TIE 12 (SUTURE) IMPLANT
SUT VIC AB 3-0 SH 18 (SUTURE) ×3 IMPLANT
SYR BULB IRRIGATION 50ML (SYRINGE) ×2 IMPLANT
SYR CONTROL 10ML LL (SYRINGE) ×2 IMPLANT
TOWEL OR 17X26 10 PK STRL BLUE (TOWEL DISPOSABLE) ×2 IMPLANT
TOWEL OR NON WOVEN STRL DISP B (DISPOSABLE) ×2 IMPLANT
YANKAUER SUCT BULB TIP 10FT TU (MISCELLANEOUS) ×2 IMPLANT

## 2015-06-18 NOTE — Op Note (Signed)
NAMEKERRILYN, [name] NO.:  [id number]  MEDICAL RECORD NO.:  [id number]  LOCATION:  19                         FACILITY:  [facility]  PHYSICIAN:  Earnstine Regal, MD      DATE OF BIRTH:  [birth year]  DATE OF PROCEDURE:  06/18/2015                              OPERATIVE REPORT   PREOPERATIVE DIAGNOSIS:  Primary hyperparathyroidism.  POSTOPERATIVE DIAGNOSES: 1. Primary hyperparathyroidism. 2. Right thyroid mass.  PROCEDURE: 1. Right inferior parathyroidectomy. 2. Right superior parathyroidectomy. 3. Right thyroid lobectomy.  SURGEON:  Earnstine Regal, MD, FACS  ANESTHESIA:  General.  ESTIMATED BLOOD LOSS:  Minimal.  PREPARATION:  ChloraPrep.  COMPLICATIONS:  None.  INDICATIONS:  The patient is a 59 year old female, referred by her endocrinologist, Dr. Jacelyn Pi, for evaluation of primary hyperparathyroidism.  The patient has a long-standing history of hypercalcemia and has been treated for a number of years for vitamin D deficiency.  She has osteopenia.  The patient had laboratory studies showing an elevated serum calcium of 10.5, an elevated intact PTH level of 74, and a normal vitamin D level of 32.6.  Nuclear medicine parathyroid scan showed a persistent focus of increased activity at the right inferior thyroid lobe consistent with parathyroid adenoma.  The patient also had bilateral thyroid nodules and had undergone previous fine-needle aspiration with benign cytopathology.  The patient now comes to the operating room for neck exploration and parathyroidectomy.  BODY OF REPORT:  Procedure was done in OR #1 at the [facility].  The patient was brought to the operating room, placed in supine position on the operating room table.  Following administration of general anesthesia, the patient was positioned, and then prepped and draped in the usual aseptic fashion.  After ascertaining that an adequate level of anesthesia had been  achieved, a right anterior neck incision was made with a #15 blade.  Dissection was carried through subcutaneous tissues and platysma.  Skin flaps were elevated cephalad and caudad, and a Weitlaner retractor was placed for exposure.  Strap muscles were incised in the midline and reflected laterally.  Thyroid lobe was exposed.  Thyroid lobe was quite firm and appeared probably multi-nodular.  It was gently mobilized and venous tributaries divided between Ligaclips.  Exploration revealed an abnormal appearing parathyroid gland.  It was relatively firm, although not overly enlarged.  It was gently dissected out taking care to avoid the underlying recurrent laryngeal nerve and esophagus, which were both positively identified.  The parathyroid gland was excised and submitted to Pathology.  Pathologist states that the gland contains mucin. Parathyroid tissue is confirmed.  The mass in the right thyroid lobe was somewhat firm and concerning. Also the parathyroid gland which was removed did not appear to be an adenoma on clinical basis.  Therefore, decision was made to proceed with right thyroid lobectomy and further right neck exploration in hopes of identifying an adenoma.  The incision was extended across the midline to the left.  Skin flaps were elevated cephalad and caudad, and a [place]orner self-retaining retractor was placed for exposure.  Right thyroid lobe was then further mobilized.  Superior pole vessels were taken down individually between medium Ligaclips with the  Harmonic scalpel.  Gland was rolled anteriorly.  Branches of the inferior thyroid artery were divided between small and medium Ligaclips, taking care to avoid the recurrent laryngeal nerve.  Superior pole was completely mobilized and at a very cephalad portion of the superior pole, was an enlarged parathyroid gland.  This was more consistent with adenoma. Parathyroid gland was dissected out and resected in its entirety.   It was submitted to Pathology.  It weighs 272 mg.  Parathyroid tissue was confirmed by Pathology.  Right thyroid lobe was then fully mobilized anteriorly.  Ligament of Gwenlyn Found was released with the electrocautery.  Gland was mobilized onto the anterior trachea and the isthmus was mobilized across the midline. Isthmus was transected at its junction with the left thyroid lobe with the Harmonic scalpel.  Right thyroid lobe was removed and submitted to Pathology for review.  Neck was irrigated with warm saline.  Fibrillar was placed throughout the operative field.  Strap muscles were reapproximated in the midline with interrupted 3-0 Vicryl sutures.  Platysma was closed with interrupted 3-0 Vicryl sutures.  Skin was closed with a running 4-0 Monocryl subcuticular suture.  Wound was washed and dried, and benzoin and Steri-Strips were applied.  Sterile dressings were applied.  The patient was awakened from anesthesia and brought to the recovery room. The patient tolerated the procedure well.   Earnstine Regal, MD, [facility] Office: [phone]   TMG/MEDQ  D:  06/18/2015  T:  06/18/2015  Job:  [id number]  cc:   Jacelyn Pi, M.D. Fax: [phone]

## 2015-06-18 NOTE — Anesthesia Postprocedure Evaluation (Signed)
Anesthesia Post Note  Patient: [name]  Procedure(s) Performed: Procedure(s) (LRB): RIGHT INFERIOR AND SUPERIOR PARATHYROIDECTOMY (Right) RIGHT LOBE THYROIDECTOMY (Right)  Patient location during evaluation: PACU Anesthesia Type: General Level of consciousness: awake and alert Pain management: pain level controlled Vital Signs Assessment: post-procedure vital signs reviewed and stable Respiratory status: spontaneous breathing, nonlabored ventilation, respiratory function stable and patient connected to nasal cannula oxygen Cardiovascular status: blood pressure returned to baseline and stable Postop Assessment: no signs of nausea or vomiting Anesthetic complications: no    Last Vitals:  Filed Vitals:   06/18/15 1238 06/18/15 1338  BP: 134/64 134/69  Pulse: 80 75  Temp: 36.8 C 37 C  Resp: 15 15    Last Pain:  Filed Vitals:   06/18/15 1351  PainSc: 3                  Shelsey Rieth L

## 2015-06-18 NOTE — Brief Op Note (Signed)
06/18/2015  9:16 AM  PATIENT:  [name]  59 y.o. female  PRE-OPERATIVE DIAGNOSIS:  PRIMARY HYPERPARATHYROIDISM  POST-OPERATIVE DIAGNOSIS:  PRIMARY HYPERPARATHYROIDISM AND RIGHT THYROID MASS  PROCEDURE:  Procedure(s): RIGHT INFERIOR AND SUPERIOR PARATHYROIDECTOMY (Right) RIGHT LOBE THYROIDECTOMY (Right)  SURGEON:  Surgeon(s) and Role:    * Armandina Gemma, MD - Primary  ANESTHESIA:   general  EBL:     BLOOD ADMINISTERED:none  DRAINS: none   LOCAL MEDICATIONS USED:  NONE  SPECIMEN:  Excision  DISPOSITION OF SPECIMEN:  PATHOLOGY  COUNTS:  YES  TOURNIQUET:  * No tourniquets in log *  DICTATION: .Other Dictation: Dictation Number [id number]  PLAN OF CARE: Admit for overnight observation  PATIENT DISPOSITION:  PACU - hemodynamically stable.   Delay start of Pharmacological VTE agent (>24hrs) due to surgical blood loss or risk of bleeding: yes  Earnstine Regal, MD, [facility] Office: [phone]

## 2015-06-18 NOTE — Transfer of Care (Signed)
Immediate Anesthesia Transfer of Care Note  Patient: [name]  Procedure(s) Performed: Procedure(s): RIGHT INFERIOR AND SUPERIOR PARATHYROIDECTOMY (Right) RIGHT LOBE THYROIDECTOMY (Right)  Patient Location: PACU  Anesthesia Type:General  Level of Consciousness: awake, alert , oriented and patient cooperative  Airway & Oxygen Therapy: Patient Spontanous Breathing and Patient connected to face mask oxygen  Post-op Assessment: Report given to RN, Post -op Vital signs reviewed and stable and Patient moving all extremities X 4  Post vital signs: stable  Last Vitals:  Filed Vitals:   06/18/15 0921  BP: 153/82  Pulse: 83  Resp: 22    Complications: No apparent anesthesia complications

## 2015-06-18 NOTE — Anesthesia Procedure Notes (Signed)
Procedure Name: Intubation Performed by: Enrigue Catena E Pre-anesthesia Checklist: Patient identified, Emergency Drugs available, Suction available and Patient being monitored Patient Re-evaluated:Patient Re-evaluated prior to inductionOxygen Delivery Method: Circle system utilized Preoxygenation: Pre-oxygenation with 100% oxygen Intubation Type: IV induction Ventilation: Mask ventilation without difficulty Laryngoscope Size: Mac and 2 Grade View: Grade I Tube type: Oral Laser Tube: Cuffed inflated with minimal occlusive pressure - saline Tube size: 7.0 mm Number of attempts: 1 Airway Equipment and Method: Stylet Placement Confirmation: ETT inserted through vocal cords under direct vision,  positive ETCO2 and breath sounds checked- equal and bilateral Secured at: 24 cm Tube secured with: Tape Dental Injury: Teeth and Oropharynx as per pre-operative assessment

## 2015-06-18 NOTE — Interval H&P Note (Signed)
History and Physical Interval Note:  06/18/2015 7:08 AM  [name]  has presented today for surgery, with the diagnosis of PRIMARY HYPERPARATHYROIDISM.  The various methods of treatment have been discussed with the patient and family. After consideration of risks, benefits and other options for treatment, the patient has consented to    Procedure(s): PARATHYROIDECTOMY (N/A) as a surgical intervention .    The patient's history has been reviewed, patient examined, no change in status, stable for surgery.  I have reviewed the patient's chart and labs.  Questions were answered to the patient's satisfaction.    Earnstine Regal, MD, [facility] Office: [place]

## 2015-06-19 ENCOUNTER — Encounter: Payer: Self-pay | Admitting: General Surgery

## 2015-06-19 DIAGNOSIS — E210 Primary hyperparathyroidism: Secondary | ICD-10-CM | POA: Diagnosis not present

## 2015-06-19 LAB — BASIC METABOLIC PANEL
ANION GAP: 8 (ref 5–15)
BUN: 12 mg/dL (ref 6–20)
CO2: 25 mmol/L (ref 22–32)
Calcium: 8.7 mg/dL — ABNORMAL LOW (ref 8.9–10.3)
Chloride: 100 mmol/L — ABNORMAL LOW (ref 101–111)
Creatinine, Ser: 0.62 mg/dL (ref 0.44–1.00)
GLUCOSE: 158 mg/dL — AB (ref 65–99)
POTASSIUM: 4 mmol/L (ref 3.5–5.1)
Sodium: 133 mmol/L — ABNORMAL LOW (ref 135–145)

## 2015-06-19 MED ORDER — HYDROCODONE-ACETAMINOPHEN 5-325 MG PO TABS: 1.0000 | ORAL_TABLET | ORAL | Status: DC | PRN

## 2015-06-19 NOTE — Progress Notes (Signed)
Quick Note:  Please contact patient and notify of benign pathology results.  Alvaro Aungst M. Brailen Macneal, MD, FACS [facility] Office: [phone]   ______ 

## 2015-06-19 NOTE — Progress Notes (Signed)
Pt states that she thought she was feverish.  Pt's oral temp is 98.8.  Pt stated she's having a hard time sleeping.  Ambien 5mg  po given

## 2015-06-19 NOTE — Discharge Instructions (Signed)
[facility] ° °THYROID & PARATHYROID SURGERY:  POST-OP INSTRUCTIONS ° °Always review your discharge instruction sheet from the facility where your surgery was performed. ° °A prescription for pain medication may be given to you upon discharge.  Take your pain medication as prescribed.  If narcotic pain medicine is not needed, then you may take acetaminophen (Tylenol) or ibuprofen (Advil) as needed. ° °Take your usually prescribed medications unless otherwise directed. ° °If you need a refill on your pain medication, please contact your pharmacy. They will contact our office to request authorization.  Prescriptions will not be processed by our office after 5 pm or on weekends. ° °Start with a light diet upon arrival home, such as soup and crackers or toast.  Be sure to drink plenty of fluids daily.  Resume your normal diet the day after surgery. ° °Most patients will experience some swelling and bruising on the chest and neck area.  Ice packs will help.  Swelling and bruising can take several days to resolve.  ° °It is common to experience some constipation after surgery.  Increasing fluid intake and taking a stool softener will usually help or prevent this problem.  A mild laxative (Milk of Magnesia or Miralax) should be taken according to package directions if there has been no bowel movement after 48 hours. ° °You have steri-strips and a gauze dressing over your incision.  You may remove the gauze bandage on the second day after surgery, and you may shower at that time.  Leave your steri-strips (small skin tapes) in place directly over the incision.  These strips should remain on the skin for 5-7 days and then be removed.  You may get them wet in the shower and pat them dry. ° °You may resume regular (light) daily activities beginning the next day - such as daily self-care, walking, climbing stairs - gradually increasing activities as tolerated.  You may have sexual intercourse when it is  comfortable.  Refrain from any heavy lifting or straining until approved by your doctor.  You may drive when you no longer are taking prescription pain medication, you can comfortably wear a seatbelt, and you can safely maneuver your car and apply brakes. ° °You should see your doctor in the office for a follow-up appointment approximately two to three weeks after your surgery.  Make sure that you call for this appointment within a day or two after you arrive home to insure a convenient appointment time. ° °WHEN TO CALL YOUR DOCTOR: °-- Fever greater than 101.5 °-- Inability to urinate °-- Nausea and/or vomiting - persistent °-- Extreme swelling or bruising °-- Continued bleeding from incision °-- Increased pain, redness, or drainage from the incision °-- Difficulty swallowing or breathing °-- Muscle cramping or spasms °-- Numbness or tingling in hands or around lips ° °The clinic staff is available to answer your questions during regular business hours.  Please don’t hesitate to call and ask to speak to one of the nurses if you have concerns. ° °Liany Mumpower M. Sundance Moise, MD, FACS °General & Endocrine Surgery °[facility] °Office: [phone] ° °Website: [email or web address] ° ° °

## 2015-06-19 NOTE — Progress Notes (Signed)
Discharge instructions and prescriptions given to patient .  Questions answered 

## 2015-06-19 NOTE — Discharge Summary (Signed)
  Physician Discharge Summary [facility]  Patient ID: ESPEN MIDGLEY MRN: [id number] DOB/AGE: 1956/02/25 59 y.o.  Admit date: 06/18/2015 Discharge date: 06/19/2015  Admission Diagnoses:  Primary hyperparathyroidism, thyroid nodules  Discharge Diagnoses:  Principal Problem:   Hyperparathyroidism, primary ([place]) Active Problems:   Right thyroid nodule   Primary hyperparathyroidism [facility])   Discharged Condition: good  Hospital Course: Patient was admitted for observation following parathyroid surgery.  Post op course was uncomplicated.  Pain was well controlled.  Tolerated diet.  Post op calcium level on morning following surgery was 8.7 mg/dl.  Patient was prepared for discharge home on POD#1.  Consults: None  Treatments: surgery: parathyroidectomy (2 glands), thyroid lobectomy  Discharge Exam: Blood pressure 119/73, pulse 76, temperature 97.9 F (36.6 C), temperature source Oral, resp. rate 16, height 5\' 3"  (1.6 m), weight 85.9 kg (189 lb 6 oz), last menstrual period 07/24/2011, SpO2 97 %.  See progress note exam by Modena Jansky, PA.   Disposition: Home  Discharge Instructions    Apply dressing    Complete by:  As directed   Apply light gauze dressing to wound before discharge home today.     Diet - low sodium heart healthy    Complete by:  As directed      Increase activity slowly    Complete by:  As directed      Remove dressing in 24 hours    Complete by:  As directed             Medication List    TAKE these medications        eszopiclone 3 MG Tabs  Generic drug:  Eszopiclone  Take 3 mg by mouth at bedtime. Take immediately before bedtime     HYDROcodone-acetaminophen 5-325 MG tablet  Commonly known as:  NORCO/VICODIN  Take 1-2 tablets by mouth every 4 (four) hours as needed for moderate pain.     sertraline 50 MG tablet  Commonly known as:  ZOLOFT  Take 50 mg by mouth daily.     Vitamin D (Ergocalciferol) 50000 UNITS Caps capsule   Commonly known as:  DRISDOL  Take 50,000 Units by mouth 2 (two) times a week. Tuesdays and Thursdays           Follow-up Information    Follow up with Earnstine Regal, MD. Schedule an appointment as soon as possible for a visit in 3 weeks.   Specialty:  General Surgery   Why:  For wound re-check   Contact information:   [place] [address] [phone]       Earnstine Regal, MD, [facility] Office: [phone]   Signed: Earnstine Regal 06/19/2015, 1:38 PM

## 2015-06-19 NOTE — Progress Notes (Signed)
1 Day Post-Op  Subjective: Sore but doing well this Am.  Site looks fine.  No swallowing or breathing issues.    Objective: Vital signs in last 24 hours: Temp:  [97.6 F (36.4 C)-98.8 F (37.1 C)] 97.9 F (36.6 C) (12/09 0600) Pulse Rate:  [66-86] 76 (12/09 0600) Resp:  [14-16] 16 (12/09 0600) BP: (114-151)/(64-80) 119/73 mmHg (12/09 0600) SpO2:  [96 %-99 %] 97 % (12/09 0600) Last BM Date: 06/17/15 Regular diet,  Afebrile, VSS  Intake/Output from previous day: 12/08 0701 - 12/09 0700 In: 2133.3 [I.V.:2133.3] Out: 1200 [Urine:1200] Intake/Output this shift: Total I/O In: 240 [P.O.:240] Out: -   General appearance: alert, cooperative and no distress Neck: no adenopathy, no carotid bruit, no JVD, supple, symmetrical, trachea midline and thyroidectomy incision looks fine.  Swallowing and breathing without issue.  she is just sore. Resp: clear to auscultation bilaterally  Lab Results:  No results for input(s): WBC, HGB, HCT, PLT in the last 72 hours.  BMET  Recent Labs  06/19/15 0413  NA 133*  K 4.0  CL 100*  CO2 25  GLUCOSE 158*  BUN 12  CREATININE 0.62  CALCIUM 8.7*   PT/INR No results for input(s): LABPROT, INR in the last 72 hours.  No results for input(s): AST, ALT, ALKPHOS, BILITOT, PROT, ALBUMIN in the last 168 hours.   Lipase  No results found for: LIPASE   Studies/Results: No results found.  Medications: . sertraline  50 mg Oral Daily   . dextrose 5 % and 0.45 % NaCl with KCl 20 mEq/L 50 mL/hr at 06/18/15 1120   Prior to Admission medications   Medication Sig Start Date End Date Taking? Authorizing Provider  Eszopiclone (ESZOPICLONE) 3 MG TABS Take 3 mg by mouth at bedtime. Take immediately before bedtime   Yes Historical Provider, MD  sertraline (ZOLOFT) 50 MG tablet Take 50 mg by mouth daily.  06/04/15  Yes Historical Provider, MD  Vitamin D, Ergocalciferol, (DRISDOL) 50000 UNITS CAPS Take 50,000 Units by mouth 2 (two) times a week. Tuesdays  and Thursdays   Yes Historical Provider, MD     Assessment/Plan  Primary hyperparathyroidism, with Right thyroid mass S/p right inferior and right superior parathyroidectomy, right thyroid lobectomy, Dr. Armandina Gemma, 06/18/15 Hx of hypertension Hypercholesterolemia Back pain Depression  Plan:  Home today and follow up with Dr. Harlow Asa in 2-3 weeks.     [name] 06/19/2015

## 2015-09-09 DIAGNOSIS — R7303 Prediabetes: Secondary | ICD-10-CM

## 2015-09-09 HISTORY — DX: Prediabetes: R73.03

## 2016-01-18 ENCOUNTER — Other Ambulatory Visit: Payer: Self-pay | Admitting: Family Medicine

## 2016-01-18 DIAGNOSIS — E041 Nontoxic single thyroid nodule: Secondary | ICD-10-CM

## 2016-01-21 ENCOUNTER — Ambulatory Visit
Admission: RE | Admit: 2016-01-21 | Discharge: 2016-01-21 | Disposition: A | Discharge status: Home / Self Care | Payer: Commercial Managed Care - PPO | Source: Ambulatory Visit | Attending: Family Medicine | Admitting: Family Medicine

## 2016-01-21 DIAGNOSIS — E041 Nontoxic single thyroid nodule: Secondary | ICD-10-CM

## 2016-04-19 ENCOUNTER — Other Ambulatory Visit: Payer: Commercial Managed Care - PPO

## 2016-09-29 ENCOUNTER — Encounter (HOSPITAL_COMMUNITY): Payer: Self-pay | Admitting: *Deleted

## 2016-09-30 NOTE — Progress Notes (Signed)
NEED ORDERS FOR 4-19 SURGERY IN EPIC THANKS

## 2016-10-03 ENCOUNTER — Ambulatory Visit: Payer: Self-pay | Admitting: Orthopedic Surgery

## 2016-10-03 NOTE — H&P (Signed)
[name] is an 61 y.o. female.   Chief Complaint: Left knee pain HPI: The patient is a 61 year old female who presents today for follow up of their knee. The patient is being followed for their left knee pain. They are now month(s) out from when symptoms began. Symptoms reported today include: pain. Current treatment includes: physical therapy, home exercise program and activity modification. The following medication has been used for pain control: none. The patient presents today following physical therapy.  [name] reports pain in the knee, worse with activity, better with rest. She has persistent pain on the medial joint line and swelling. Occasionally, it will feel like it is giving way on her. She has tried cortisone injections and physical therapy.  Past Medical History:  Diagnosis Date  . Depression   . Fibroid   . H/O seasonal allergies   . Hearing impaired    bilaterally-wears hearing aids"hereditary""reads Lips"  . Heart murmur   . Hypercalcemia   . Insomnia   . OAB (overactive bladder)   . Vitamin D deficiency     Past Surgical History:  Procedure Laterality Date  . arm fracture Left    left wrist pinning and removal  . BROW LIFT AND BLEPHAROPLASTY Bilateral   . COLONOSCOPY    . EYE SURGERY    . PARATHYROIDECTOMY Right 06/18/2015   Procedure: RIGHT INFERIOR AND SUPERIOR PARATHYROIDECTOMY;  Surgeon: Armandina Gemma, MD;  Location: [facility];  Service: General;  Laterality: Right;  . THYROIDECTOMY Right 06/18/2015   Procedure: RIGHT LOBE THYROIDECTOMY;  Surgeon: Armandina Gemma, MD;  Location: [facility];  Service: General;  Laterality: Right;  . TONSILLECTOMY    . WISDOM TOOTH EXTRACTION      Family History  Problem Relation Age of Onset  . Osteoporosis Mother   . Hypertension Mother   . Diabetes Mother   . Breast cancer Sister 40    d/c 2011 BRCA1/2 NEG  . Breast cancer Maternal Aunt    Social History:  reports that she has quit smoking. Her smoking use included  Cigarettes. She has a 0.50 pack-year smoking history. She has never used smokeless tobacco. She reports that she drinks alcohol. She reports that she does not use drugs.  Allergies: No Known Allergies   (Not in a hospital admission)  No results found for this or any previous visit (from the past 48 hour(s)). No results found.  Review of Systems  Constitutional: Negative.   HENT: Negative.   Eyes: Negative.   Respiratory: Negative.   Cardiovascular: Negative.   Gastrointestinal: Negative.   Genitourinary: Negative.   Musculoskeletal: Positive for joint pain.  Skin: Negative.   Neurological: Negative.     Last menstrual period 07/24/2011. Physical Exam  Constitutional: She is oriented to person, place, and time. She appears well-developed.  HENT:  Head: Normocephalic.  Eyes: Pupils are equal, round, and reactive to light.  Neck: Normal range of motion.  Cardiovascular: Normal rate.   Respiratory: Effort normal.  GI: Soft.  Musculoskeletal:  On exam, tender medial joint line, has a McMurray. Patellofemoral pain to compression. Lacks 10 degrees of flexion. Knee exam on inspection reveals no evidence of soft tissue swelling, ecchymosis, deformity or erythema. On palpation there is no tenderness in the medial and lateral joint line. No patellofemoral pain with compression. Nontender over the fibular head or the peroneal nerve. Nontender over the quadriceps insertion of the patellar ligament insertion. The range of motion was full. Provocative maneuvers revealed a negative Lachman,  negative anterior and posterior drawer and a negative McMurray. No instability was noted with varus and valgus stressing at 0 or 30 degrees. On manual motor test the quadriceps and hamstrings were 5/5. Sensory exam was intact to light touch.  Neurological: She is alert and oriented to person, place, and time.  Skin: Skin is warm and dry.      X-rays demonstrates minimal medial joint space narrowing.  MRI  demonstrates a large joint effusion, radial tear of the posterior horn of the medial meniscus and some chondromalacia of the patellofemoral joint, also of the medial compartment.  Assessment/Plan Persistent knee pain despite rest, activity modification, home exercise program and injection with MRI indicating radial tear of the meniscus. She does have some mechanical symptoms, a feeling of giving way. She has reduced her level of activity.  We discussed options of living with her symptoms, repeat injection, continued activity modification, exercise program versus knee arthroscopy. Mechanical symptoms of fusion. Discussed knee arthroscopy evaluating the meniscus, partial meniscectomy, evaluating the chondral service, performing chondroplasties and a lavage. I had a long discussion with the patient concerning the risks and benefits of knee arthroscopy including help from the arthroscopic procedure as well as no help from the arthroscopic procedure or worsening of symptoms. Also discussed infection, DVT, PE, anesthetic complications, etc. Also discussed the possibility of repeat arthroscopic surgery required in the future or total knee replacement. I provided the patient with an illustrated handout and discussed it in detail as well as discussed the postoperative and perioperative courses and return to functional activities including work. Need for postoperative DVT prophylaxis was discussed as well. Following that, we discussed the postoperative course in detail and for residual symptoms, we discussed viscosupplementation. We also indicated we could not reverse arthritic changes. She understands. Again, with persistent symptoms, we will proceed.  Plan left knee arthroscopy, partial medial meniscectomy, debridement  Cecilie Kicks., PA-C for Dr. Tonita Cong 10/03/2016, 8:37 AM

## 2016-10-05 ENCOUNTER — Other Ambulatory Visit: Payer: Self-pay | Admitting: Specialist

## 2016-10-17 NOTE — Patient Instructions (Addendum)
[name]  10/17/2016   Your procedure is scheduled on: 10-27-16  Report to [facility] Main  Entrance ; Follow signs to Short Stay on first floor at 530AM  Call this number if you have problems the morning of surgery [phone]   Remember: ONLY 1 PERSON MAY GO WITH YOU TO SHORT STAY TO GET  READY MORNING OF YOUR SURGERY.  Do not eat food or drink liquids :After Midnight.     Take these medicines the morning of surgery with A SIP OF WATER: levothyroxine(synthroid)                         You may not have any metal on your body including hair pins and              piercings  Do not wear jewelry, make-up, lotions, powders or perfumes, deodorant             Do not wear nail polish.  Do not shave  48 hours prior to surgery.          Do not bring valuables to the hospital. [place].  Contacts, dentures or bridgework may not be worn into surgery.       Patients discharged the day of surgery will not be allowed to drive home.  Name and phone number of your driver:  Special Instructions: N/A              Please read over the following fact sheets you were given: _____________________________________________________________________             [facility] - Preparing for Surgery Before surgery, you can play an important role.  Because skin is not sterile, your skin needs to be as free of germs as possible.  You can reduce the number of germs on your skin by washing with CHG (chlorahexidine gluconate) soap before surgery.  CHG is an antiseptic cleaner which kills germs and bonds with the skin to continue killing germs even after washing. Please DO NOT use if you have an allergy to CHG or antibacterial soaps.  If your skin becomes reddened/irritated stop using the CHG and inform your nurse when you arrive at Short Stay. Do not shave (including legs and underarms) for at least 48 hours prior to the first CHG  shower.  You may shave your face/neck. Please follow these instructions carefully:  1.  Shower with CHG Soap the night before surgery and the  morning of Surgery.  2.  If you choose to wash your hair, wash your hair first as usual with your  normal  shampoo.  3.  After you shampoo, rinse your hair and body thoroughly to remove the  shampoo.                           4.  Use CHG as you would any other liquid soap.  You can apply chg directly  to the skin and wash                       Gently with a scrungie or clean washcloth.  5.  Apply the CHG Soap to your body ONLY FROM THE NECK DOWN.  Do not use on face/ open                           Wound or open sores. Avoid contact with eyes, ears mouth and genitals (private parts).                       Wash face,  Genitals (private parts) with your normal soap.             6.  Wash thoroughly, paying special attention to the area where your surgery  will be performed.  7.  Thoroughly rinse your body with warm water from the neck down.  8.  DO NOT shower/wash with your normal soap after using and rinsing off  the CHG Soap.                9.  Pat yourself dry with a clean towel.            10.  Wear clean pajamas.            11.  Place clean sheets on your bed the night of your first shower and do not  sleep with pets. Day of Surgery : Do not apply any lotions/deodorants the morning of surgery.  Please wear clean clothes to the hospital/surgery center.  FAILURE TO FOLLOW THESE INSTRUCTIONS MAY RESULT IN THE CANCELLATION OF YOUR SURGERY PATIENT SIGNATURE_________________________________  NURSE SIGNATURE__________________________________  ________________________________________________________________________   Adam Phenix  An incentive spirometer is a tool that can help keep your lungs clear and active. This tool measures how well you are filling your lungs with each breath. Taking long deep breaths may help reverse or decrease the chance  of developing breathing (pulmonary) problems (especially infection) following:  A long period of time when you are unable to move or be active. BEFORE THE PROCEDURE   If the spirometer includes an indicator to show your best effort, your nurse or respiratory therapist will set it to a desired goal.  If possible, sit up straight or lean slightly forward. Try not to slouch.  Hold the incentive spirometer in an upright position. INSTRUCTIONS FOR USE  1. Sit on the edge of your bed if possible, or sit up as far as you can in bed or on a chair. 2. Hold the incentive spirometer in an upright position. 3. Breathe out normally. 4. Place the mouthpiece in your mouth and seal your lips tightly around it. 5. Breathe in slowly and as deeply as possible, raising the piston or the ball toward the top of the column. 6. Hold your breath for 3-5 seconds or for as long as possible. Allow the piston or ball to fall to the bottom of the column. 7. Remove the mouthpiece from your mouth and breathe out normally. 8. Rest for a few seconds and repeat Steps 1 through 7 at least 10 times every 1-2 hours when you are awake. Take your time and take a few normal breaths between deep breaths. 9. The spirometer may include an indicator to show your best effort. Use the indicator as a goal to work toward during each repetition. 10. After each set of 10 deep breaths, practice coughing to be sure your lungs are clear. If you have an incision (the cut made at the time of surgery), support your incision when coughing by placing a pillow or rolled up towels firmly against it. Once you are able to get out of  bed, walk around indoors and cough well. You may stop using the incentive spirometer when instructed by your caregiver.  RISKS AND COMPLICATIONS  Take your time so you do not get dizzy or light-headed.  If you are in pain, you may need to take or ask for pain medication before doing incentive spirometry. It is harder to  take a deep breath if you are having pain. AFTER USE  Rest and breathe slowly and easily.  It can be helpful to keep track of a log of your progress. Your caregiver can provide you with a simple table to help with this. If you are using the spirometer at home, follow these instructions: [place] IF:   You are having difficultly using the spirometer.  You have trouble using the spirometer as often as instructed.  Your pain medication is not giving enough relief while using the spirometer.  You develop fever of 100.5 F (38.1 C) or higher. SEEK IMMEDIATE MEDICAL CARE IF:   You cough up bloody sputum that had not been present before.  You develop fever of 102 F (38.9 C) or greater.  You develop worsening pain at or near the incision site. MAKE SURE YOU:   Understand these instructions.  Will watch your condition.  Will get help right away if you are not doing well or get worse. Document Released: 11/07/2006 Document Revised: 09/19/2011 Document Reviewed: 01/08/2007 [facility] Patient Information 2014 [place], [place].   ________________________________________________________________________

## 2016-10-19 ENCOUNTER — Encounter (HOSPITAL_COMMUNITY): Payer: Self-pay

## 2016-10-19 ENCOUNTER — Encounter (HOSPITAL_COMMUNITY)
Admission: RE | Admit: 2016-10-19 | Discharge: 2016-10-19 | Disposition: A | Discharge status: Home / Self Care | Payer: Commercial Managed Care - PPO | Source: Ambulatory Visit | Attending: Specialist | Admitting: Specialist

## 2016-10-19 ENCOUNTER — Ambulatory Visit: Payer: Self-pay | Admitting: Orthopedic Surgery

## 2016-10-19 DIAGNOSIS — Z01818 Encounter for other preprocedural examination: Secondary | ICD-10-CM | POA: Insufficient documentation

## 2016-10-19 DIAGNOSIS — X58XXXA Exposure to other specified factors, initial encounter: Secondary | ICD-10-CM | POA: Insufficient documentation

## 2016-10-19 DIAGNOSIS — M1712 Unilateral primary osteoarthritis, left knee: Secondary | ICD-10-CM | POA: Diagnosis not present

## 2016-10-19 DIAGNOSIS — S83242A Other tear of medial meniscus, current injury, left knee, initial encounter: Secondary | ICD-10-CM | POA: Diagnosis not present

## 2016-10-19 HISTORY — DX: Adverse effect of unspecified anesthetic, initial encounter: T41.45XA

## 2016-10-19 HISTORY — DX: Other complications of anesthesia, initial encounter: T88.59XA

## 2016-10-19 LAB — CBC
HCT: 44.8 % (ref 36.0–46.0)
HEMOGLOBIN: 14.9 g/dL (ref 12.0–15.0)
MCH: 29.5 pg (ref 26.0–34.0)
MCHC: 33.3 g/dL (ref 30.0–36.0)
MCV: 88.7 fL (ref 78.0–100.0)
Platelets: 324 10*3/uL (ref 150–400)
RBC: 5.05 MIL/uL (ref 3.87–5.11)
RDW: 13.2 % (ref 11.5–15.5)
WBC: 8.6 10*3/uL (ref 4.0–10.5)

## 2016-10-19 LAB — BASIC METABOLIC PANEL
ANION GAP: 6 (ref 5–15)
BUN: 18 mg/dL (ref 6–20)
CHLORIDE: 105 mmol/L (ref 101–111)
CO2: 30 mmol/L (ref 22–32)
CREATININE: 0.61 mg/dL (ref 0.44–1.00)
Calcium: 9.7 mg/dL (ref 8.9–10.3)
GFR calc non Af Amer: >60 mL/min (ref >60)
Glucose, Bld: 119 mg/dL — ABNORMAL HIGH (ref 65–99)
POTASSIUM: 5 mmol/L (ref 3.5–5.1)
SODIUM: 141 mmol/L (ref 135–145)

## 2016-10-19 LAB — SURGICAL PCR SCREEN
MRSA, PCR: NEGATIVE
STAPHYLOCOCCUS AUREUS: POSITIVE — AB

## 2016-10-19 LAB — ABO/RH: ABO/RH(D): A POS

## 2016-10-27 ENCOUNTER — Ambulatory Visit (HOSPITAL_COMMUNITY): Payer: Commercial Managed Care - PPO | Admitting: Anesthesiology

## 2016-10-27 ENCOUNTER — Ambulatory Visit (HOSPITAL_COMMUNITY)
Admission: RE | Admit: 2016-10-27 | Discharge: 2016-10-27 | Disposition: A | Discharge status: Home / Self Care | Payer: Commercial Managed Care - PPO | Source: Ambulatory Visit | Attending: Specialist | Admitting: Specialist

## 2016-10-27 ENCOUNTER — Encounter (HOSPITAL_COMMUNITY)
Admission: RE | Disposition: A | Discharge status: Home / Self Care | Payer: Self-pay | Source: Ambulatory Visit | Attending: Specialist

## 2016-10-27 ENCOUNTER — Encounter (HOSPITAL_COMMUNITY): Payer: Self-pay | Admitting: *Deleted

## 2016-10-27 DIAGNOSIS — F329 Major depressive disorder, single episode, unspecified: Secondary | ICD-10-CM | POA: Diagnosis not present

## 2016-10-27 DIAGNOSIS — Z87891 Personal history of nicotine dependence: Secondary | ICD-10-CM | POA: Insufficient documentation

## 2016-10-27 DIAGNOSIS — M1712 Unilateral primary osteoarthritis, left knee: Secondary | ICD-10-CM | POA: Insufficient documentation

## 2016-10-27 DIAGNOSIS — X58XXXA Exposure to other specified factors, initial encounter: Secondary | ICD-10-CM | POA: Diagnosis not present

## 2016-10-27 DIAGNOSIS — M2242 Chondromalacia patellae, left knee: Secondary | ICD-10-CM | POA: Insufficient documentation

## 2016-10-27 DIAGNOSIS — Z79899 Other long term (current) drug therapy: Secondary | ICD-10-CM | POA: Insufficient documentation

## 2016-10-27 DIAGNOSIS — E559 Vitamin D deficiency, unspecified: Secondary | ICD-10-CM | POA: Insufficient documentation

## 2016-10-27 DIAGNOSIS — S83242A Other tear of medial meniscus, current injury, left knee, initial encounter: Secondary | ICD-10-CM | POA: Insufficient documentation

## 2016-10-27 DIAGNOSIS — M1732 Unilateral post-traumatic osteoarthritis, left knee: Secondary | ICD-10-CM

## 2016-10-27 HISTORY — PX: KNEE ARTHROSCOPY WITH MEDIAL MENISECTOMY: SHX5651

## 2016-10-27 LAB — TYPE AND SCREEN
ABO/RH(D): A POS
Antibody Screen: NEGATIVE

## 2016-10-27 SURGERY — ARTHROSCOPY, KNEE, WITH MEDIAL MENISCECTOMY
Anesthesia: General | Site: Knee | Laterality: Left

## 2016-10-27 MED ORDER — SODIUM CHLORIDE 0.9 % IR SOLN
Status: AC
  Filled 2016-10-27: qty 500000

## 2016-10-27 MED ORDER — ONDANSETRON HCL 4 MG/2ML IJ SOLN
INTRAMUSCULAR | Status: DC | PRN
  Administered 2016-10-27: 4 mg via INTRAVENOUS

## 2016-10-27 MED ORDER — FENTANYL CITRATE (PF) 100 MCG/2ML IJ SOLN
INTRAMUSCULAR | Status: AC
  Filled 2016-10-27: qty 2

## 2016-10-27 MED ORDER — DEXAMETHASONE SODIUM PHOSPHATE 4 MG/ML IJ SOLN
INTRAMUSCULAR | Status: DC | PRN
  Administered 2016-10-27: 4 mg via INTRAVENOUS

## 2016-10-27 MED ORDER — LACTATED RINGERS IV SOLN
INTRAVENOUS | Status: DC | PRN
  Administered 2016-10-27: 07:00:00 via INTRAVENOUS

## 2016-10-27 MED ORDER — PROMETHAZINE HCL 25 MG/ML IJ SOLN: 6.2500 mg | INTRAMUSCULAR | Status: DC | PRN

## 2016-10-27 MED ORDER — BUPIVACAINE-EPINEPHRINE (PF) 0.5% -1:200000 IJ SOLN
INTRAMUSCULAR | Status: AC
  Filled 2016-10-27: qty 30

## 2016-10-27 MED ORDER — PROPOFOL 10 MG/ML IV BOLUS
INTRAVENOUS | Status: AC
  Filled 2016-10-27: qty 40

## 2016-10-27 MED ORDER — FENTANYL CITRATE (PF) 100 MCG/2ML IJ SOLN
INTRAMUSCULAR | Status: DC | PRN
  Administered 2016-10-27: 25 ug via INTRAVENOUS
  Administered 2016-10-27: 50 ug via INTRAVENOUS

## 2016-10-27 MED ORDER — PROPOFOL 10 MG/ML IV BOLUS
INTRAVENOUS | Status: DC | PRN
  Administered 2016-10-27: 170 mg via INTRAVENOUS

## 2016-10-27 MED ORDER — HYDROMORPHONE HCL 2 MG/ML IJ SOLN
INTRAMUSCULAR | Status: AC
  Filled 2016-10-27: qty 1

## 2016-10-27 MED ORDER — OXYCODONE-ACETAMINOPHEN 5-325 MG PO TABS: 1.0000 | ORAL_TABLET | ORAL | 0 refills | Status: DC | PRN

## 2016-10-27 MED ORDER — LIDOCAINE 2% (20 MG/ML) 5 ML SYRINGE
INTRAMUSCULAR | Status: AC
  Filled 2016-10-27: qty 5

## 2016-10-27 MED ORDER — EPINEPHRINE PF 1 MG/ML IJ SOLN
INTRAMUSCULAR | Status: AC
  Filled 2016-10-27: qty 2

## 2016-10-27 MED ORDER — MIDAZOLAM HCL 2 MG/2ML IJ SOLN
INTRAMUSCULAR | Status: AC
  Filled 2016-10-27: qty 2

## 2016-10-27 MED ORDER — CEFAZOLIN SODIUM-DEXTROSE 2-3 GM-% IV SOLR
INTRAVENOUS | Status: DC | PRN
  Administered 2016-10-27: 2 g via INTRAVENOUS

## 2016-10-27 MED ORDER — IBUPROFEN 800 MG PO TABS: 800.0000 mg | ORAL_TABLET | Freq: Three times a day (TID) | ORAL | 1 refills | Status: DC | PRN

## 2016-10-27 MED ORDER — ONDANSETRON HCL 4 MG/2ML IJ SOLN
INTRAMUSCULAR | Status: AC
  Filled 2016-10-27: qty 2

## 2016-10-27 MED ORDER — OXYCODONE-ACETAMINOPHEN 5-325 MG PO TABS: 1.0000 | ORAL_TABLET | Freq: Once | ORAL | Status: DC

## 2016-10-27 MED ORDER — CEFAZOLIN SODIUM-DEXTROSE 2-4 GM/100ML-% IV SOLN
INTRAVENOUS | Status: DC
Start: 2016-10-27 — End: 2016-10-27
  Filled 2016-10-27: qty 100

## 2016-10-27 MED ORDER — LIDOCAINE HCL 1 % IJ SOLN
INTRAMUSCULAR | Status: DC | PRN
  Administered 2016-10-27: 50 mg via INTRADERMAL

## 2016-10-27 MED ORDER — LACTATED RINGERS IV SOLN
INTRAVENOUS | Status: DC
  Administered 2016-10-27: 09:00:00 via INTRAVENOUS

## 2016-10-27 MED ORDER — EPINEPHRINE PF 1 MG/ML IJ SOLN
INTRAMUSCULAR | Status: DC | PRN
  Administered 2016-10-27: 1 mg

## 2016-10-27 MED ORDER — BUPIVACAINE-EPINEPHRINE 0.5% -1:200000 IJ SOLN
INTRAMUSCULAR | Status: DC | PRN
  Administered 2016-10-27: 10 mL

## 2016-10-27 MED ORDER — HYDROMORPHONE HCL 2 MG/ML IJ SOLN
0.2500 mg | INTRAMUSCULAR | Status: DC | PRN
  Administered 2016-10-27: 0.5 mg via INTRAVENOUS

## 2016-10-27 MED ORDER — MIDAZOLAM HCL 5 MG/5ML IJ SOLN
INTRAMUSCULAR | Status: DC | PRN
  Administered 2016-10-27: 2 mg via INTRAVENOUS

## 2016-10-27 SURGICAL SUPPLY — 26 items
BANDAGE ACE 6X5 VEL STRL LF (GAUZE/BANDAGES/DRESSINGS) ×2 IMPLANT
BLADE 4.2CUDA (BLADE) IMPLANT
BLADE CUDA SHAVER 3.5 (BLADE) ×2 IMPLANT
BOOTIES KNEE HIGH SLOAN (MISCELLANEOUS) ×4 IMPLANT
CLOTH 2% CHLOROHEXIDINE 3PK (PERSONAL CARE ITEMS) ×2 IMPLANT
COVER SURGICAL LIGHT HANDLE (MISCELLANEOUS) ×2 IMPLANT
DRSG EMULSION OIL 3X3 NADH (GAUZE/BANDAGES/DRESSINGS) ×2 IMPLANT
DRSG PAD ABDOMINAL 8X10 ST (GAUZE/BANDAGES/DRESSINGS) ×4 IMPLANT
DURAPREP 26ML APPLICATOR (WOUND CARE) ×2 IMPLANT
GAUZE SPONGE 4X4 12PLY STRL (GAUZE/BANDAGES/DRESSINGS) ×4 IMPLANT
GLOVE BIOGEL PI IND STRL 7.0 (GLOVE) IMPLANT
GLOVE BIOGEL PI INDICATOR 7.0 (GLOVE)
GLOVE SURG SS PI 7.0 STRL IVOR (GLOVE) ×2 IMPLANT
GLOVE SURG SS PI 7.5 STRL IVOR (GLOVE) ×2 IMPLANT
GLOVE SURG SS PI 8.0 STRL IVOR (GLOVE) ×2 IMPLANT
GOWN STRL REUS W/TWL XL LVL3 (GOWN DISPOSABLE) ×3 IMPLANT
KIT BASIN OR (CUSTOM PROCEDURE TRAY) ×2 IMPLANT
MANIFOLD NEPTUNE II (INSTRUMENTS) ×2 IMPLANT
PACK ARTHROSCOPY WL (CUSTOM PROCEDURE TRAY) ×2 IMPLANT
PADDING CAST COTTON 6X4 STRL (CAST SUPPLIES) ×2 IMPLANT
SUT ETHILON 4 0 PS 2 18 (SUTURE) ×2 IMPLANT
TOWEL OR 17X26 10 PK STRL BLUE (TOWEL DISPOSABLE) ×2 IMPLANT
TUBING ARTHRO INFLOW-ONLY STRL (TUBING) ×2 IMPLANT
WAND HAND CNTRL MULTIVAC 50 (MISCELLANEOUS) ×2 IMPLANT
WAND HAND CNTRL MULTIVAC 90 (MISCELLANEOUS) IMPLANT
WRAP KNEE MAXI GEL POST OP (GAUZE/BANDAGES/DRESSINGS) ×2 IMPLANT

## 2016-10-27 NOTE — Interval H&P Note (Signed)
History and Physical Interval Note:  10/27/2016 7:16 AM  [name]  has presented today for surgery, with the diagnosis of Degenerative joint disease, medial meniscal tear left knee  The various methods of treatment have been discussed with the patient and family. After consideration of risks, benefits and other options for treatment, the patient has consented to  Procedure(s): LEFT KNEE ARTHROSCOPY, PARTIAL MEDIAL MENISECTOMY, DEBRIDEMENT (Left) as a surgical intervention .  The patient's history has been reviewed, patient examined, no change in status, stable for surgery.  I have reviewed the patient's chart and labs.  Questions were answered to the patient's satisfaction.     Calandria Mullings C

## 2016-10-27 NOTE — Anesthesia Postprocedure Evaluation (Addendum)
Anesthesia Post Note  Patient: Dena Esperanza Hippe  Procedure(s) Performed: Procedure(s) (LRB): LEFT KNEE ARTHROSCOPY, PARTIAL MEDIAL MENISECTOMY, DEBRIDEMENT (Left)  Patient location during evaluation: PACU Anesthesia Type: General Level of consciousness: awake and alert Pain management: pain level controlled Vital Signs Assessment: post-procedure vital signs reviewed and stable Respiratory status: spontaneous breathing, nonlabored ventilation, respiratory function stable and patient connected to nasal cannula oxygen Cardiovascular status: blood pressure returned to baseline and stable Postop Assessment: no signs of nausea or vomiting Anesthetic complications: no       Last Vitals:  Vitals:   10/27/16 0943 10/27/16 1015  BP: 126/80 138/79  Pulse: 74 71  Resp: 12 18  Temp: 36.4 C 36.4 C    Last Pain:  Vitals:   10/27/16 1015  TempSrc: Oral  PainSc:                  Ian Cavey,JAMES TERRILL

## 2016-10-27 NOTE — Anesthesia Procedure Notes (Signed)
Procedure Name: LMA Insertion Date/Time: 10/27/2016 7:37 AM Performed by: Sherian Maroon A Pre-anesthesia Checklist: Patient identified, Emergency Drugs available, Suction available, Patient being monitored and Timeout performed Patient Re-evaluated:Patient Re-evaluated prior to inductionOxygen Delivery Method: Circle system utilized Preoxygenation: Pre-oxygenation with 100% oxygen Intubation Type: IV induction Ventilation: Mask ventilation without difficulty LMA: LMA inserted LMA Size: 4.0 Number of attempts: 1 Tube secured with: Tape Dental Injury: Teeth and Oropharynx as per pre-operative assessment

## 2016-10-27 NOTE — Op Note (Signed)
NAME:  [name], [name]              ACCOUNT NO.:  [id number]  MEDICAL RECORD NO.:  [id number]  LOCATION:  [facility]                         FACILITY:  [facility]  PHYSICIAN:  Susa Day, M.D.    DATE OF BIRTH:  27-Feb-1956  DATE OF PROCEDURE:  10/27/2016 DATE OF DISCHARGE:                              OPERATIVE REPORT   PREOPERATIVE DIAGNOSIS:  Medial meniscus tear and degenerative joint disease, left knee.  POSTOPERATIVE DIAGNOSES: 1. Medial meniscus tear, left knee. 2. Grade 3 chondromalacia of patellofemoral joint, medial femoral     condyle, medial tibial plateau.  PROCEDURES PERFORMED: 1. Left knee arthroscopy and lavage. 2. Partial medial meniscectomy. 3. Chondroplasty, patellofemoral joint; medial femoral condyle, medial     tibial plateau, and shaving the lateral meniscus.  HISTORY:  A 60, locking, popping, giving way, and swelling, refractory to conservative treatment including injection, exercise.  She had an MRI indicating medial meniscus tear, indicated for knee arthroscopy, debridement and lavage and partial menisectomy.  Risks and benefits discussed including bleeding, infection, damage to neurovascular structures, no change in symptoms, worsening symptoms, DVT, PE, anesthetic complications, etc.  TECHNIQUE:  With the patient in supine position, after induction of adequate general anesthesia, 2 g Kefzol, left lower extremity was prepped and draped in usual sterile fashion.  A lateral parapatellar portal was fashioned with a #11 blade.  Ingress cannula atraumatically placed.  Irrigant was utilized to insufflate the joint.  Under direct visualization, medial parapatellar portal was fashioned with a #11 blade after localization with an 18-gauge needle sparing the medial meniscus. Noted medially was extensive tearing of posterior half of the medial meniscus extending into the root.  I introduced a basket and resected it to a stable base.  Approximately one third of  posterior third was excised.  Further contoured with a 3.5 shaver.  Loose cartilaginous debris was noted and joint fluid which was evacuated initially.  Loose bodies were evacuated with shaver and lavaged.  Light chondroplasty performed in femoral condyle and tibial plateau, extending the weightbearing surface.  No grade 4 changes.  Remnant medial meniscus stable to probe palpation.  ACL unremarkable.  Lateral compartment, loose cartilaginous debris.  Mild fraying of the lateral meniscus.  This was shaved and debris was lavaged, but fairly intact lateral compartment.  Suprapatellar pouch, grade 3 change of patellofemoral joint, light chondroplasty was performed here.  Normal patellofemoral tracking. Gutters unremarkable.  Revisit all compartments.  No further pathology amenable to arthroscopic intervention.  I therefore, removed all instrumentation.  Portals were closed with 4-0 nylon simple sutures.  A 0.25% Marcaine with epinephrine was infiltrated in joint. Wound was dressed sterilely, awoken without difficulty and transported to the recovery room in satisfactory condition.  The patient tolerated the procedure well.  No complications.  Assistant, Cleophas Dunker, [place].  Minimal blood loss.     Susa Day, M.D.     Geralynn Rile  D:  10/27/2016  T:  10/27/2016  Job:  [id number]

## 2016-10-27 NOTE — Transfer of Care (Signed)
Immediate Anesthesia Transfer of Care Note  Patient: [name]  Procedure(s) Performed: Procedure(s): LEFT KNEE ARTHROSCOPY, PARTIAL MEDIAL MENISECTOMY, DEBRIDEMENT (Left)  Patient Location: PACU  Anesthesia Type:General  Level of Consciousness: awake, alert  and oriented  Airway & Oxygen Therapy: Patient Spontanous Breathing and Patient connected to face mask oxygen  Post-op Assessment: Report given to RN and Post -op Vital signs reviewed and stable  Post vital signs: Reviewed and stable  Last Vitals:  Vitals:   10/27/16 0541  BP: (!) 165/82  Pulse: 71  Resp: 16  Temp: 36.5 C    Last Pain:  Vitals:   10/27/16 0620  TempSrc:   PainSc: 5       Patients Stated Pain Goal: 4 (65/46/50 3546)  Complications: No apparent anesthesia complications

## 2016-10-27 NOTE — Anesthesia Preprocedure Evaluation (Addendum)
Anesthesia Evaluation  Patient identified by MRN, date of birth, ID band Patient awake    Reviewed: Allergy & Precautions, NPO status , Patient's Chart, lab work & pertinent test results  History of Anesthesia Complications (+) Emergence Delirium  Airway Mallampati: II  TM Distance: >3 FB Neck ROM: Full    Dental no notable dental hx.    Pulmonary former smoker,    breath sounds clear to auscultation       Cardiovascular negative cardio ROS   Rhythm:Regular Rate:Normal     Neuro/Psych  Neuromuscular disease    GI/Hepatic negative GI ROS, Neg liver ROS,   Endo/Other  negative endocrine ROS  Renal/GU negative Renal ROS     Musculoskeletal   Abdominal   Peds  Hematology negative hematology ROS (+)   Anesthesia Other Findings   Reproductive/Obstetrics                            Anesthesia Physical Anesthesia Plan  ASA: II  Anesthesia Plan: General   Post-op Pain Management:    Induction: Intravenous  Airway Management Planned: LMA  Additional Equipment:   Intra-op Plan:   Post-operative Plan: Extubation in OR  Informed Consent: I have reviewed the patients History and Physical, chart, labs and discussed the procedure including the risks, benefits and alternatives for the proposed anesthesia with the patient or authorized representative who has indicated his/her understanding and acceptance.   Dental advisory given  Plan Discussed with:   Anesthesia Plan Comments:         Anesthesia Quick Evaluation

## 2016-10-27 NOTE — Brief Op Note (Signed)
10/27/2016  8:11 AM  PATIENT:  [name]  61 y.o. female  PRE-OPERATIVE DIAGNOSIS:  Degenerative joint disease, medial meniscal tear left knee  POST-OPERATIVE DIAGNOSIS:  Degenerative joint disease, medial meniscal tear left knee  PROCEDURE:  Procedure(s): LEFT KNEE ARTHROSCOPY, PARTIAL MEDIAL MENISECTOMY, DEBRIDEMENT (Left)  SURGEON:  Surgeon(s) and Role:    * Susa Day, MD - Primary  PHYSICIAN ASSISTANT:   ASSISTANTS: [place]   ANESTHESIA:   general  EBL:  No intake/output data recorded.  BLOOD ADMINISTERED:none  DRAINS: none   LOCAL MEDICATIONS USED:  MARCAINE     SPECIMEN:  No Specimen  DISPOSITION OF SPECIMEN:  N/A  COUNTS:  YES  TOURNIQUET:  * No tourniquets in log *  DICTATION: .Other Dictation: Dictation Number [id number]  PLAN OF CARE: Discharge to home after PACU  PATIENT DISPOSITION:  PACU - hemodynamically stable.   Delay start of Pharmacological VTE agent (>24hrs) due to surgical blood loss or risk of bleeding: no

## 2016-10-27 NOTE — Discharge Instructions (Signed)
ARTHROSCOPIC KNEE SURGERY HOME CARE INSTRUCTIONS   PAIN You will be expected to have a moderate amount of pain in the affected knee for approximately two weeks.  However, the first two to four days will be the most severe in terms of the pain you will experience.  Prescriptions have been provided for you to take as needed for the pain.  The pain can be markedly reduced by using the ice/compressive bandage given.  Exchange the ice packs whenever they thaw.  During the night, keep the bandage on because it will still provide some compression for the swelling.  Also, keep the leg elevated on pillows above your heart, and this will help alleviate the pain and swelling.  MEDICATION Prescriptions have been provided to take as needed for pain. To prevent blood clots, take Aspirin 325mg daily with a meal if not on a blood thinner and if no history of stomach ulcers.  ACTIVITY It is preferred that you stay on bedrest for approximately 24 hours.  However, you may go to the bathroom with help.  After this, you can start to be up and about progressively more.  Remember that the swelling may still increase after three to four days if you are up and doing too much.  You may put as much weight on the affected leg as pain will allow.  Use your crutches for comfort and safety.  However, as soon as you are able, you may discard the crutches and go without them.   DRESSING Keep the current dressing as dry as possible.  Two days after your surgery, you may remove the ice/compressive wrap, and surgical dressing.  You may now take a shower, but do not scrub the sounds directly with soap.  Let water rinse over these and gently wipe with your hand.  Reapply band-aids over the puncture wounds and more gauze if needed.  A slight amount of thin drainage can be normal at this time, and do not let it frighten you.  Reapply the ice/compressive wrap.  You may now repeat this every day each time you shower.  SYMPTOMS TO REPORT TO  YOUR DOCTOR  -Extreme pain.  -Extreme swelling.  -Temperature above 101 degrees that does not come down with acetaminophen     (Tylenol).  -Any changes in the feeling, color or movement of your toes.  -Extreme redness, heat, swelling or drainage at your incision  EXERCISE It is preferred that you begin to exercise on the day of your surgery.  Straight leg raises and short arc quads should be begun the afternoon or evening of surgery and continued until you come back for your follow-up appointment.   Attached is an instruction sheet on how to perform these two simple exercises.  Do these at least three times per day if not more.  You may bend your knee as much as is comfortable.  The puncture wounds may occasionally be slightly uncomfortable with bending of the knee.  Do not let this frighten you.  It is important to keep your knee motion, but do not overdo it.  If you have significant pain, simply do not bend the knee as far.   You will be given more exercises to perform at your first return visit.    RETURN APPOINTMENT Please make an appointment to be seen by your doctor in 10-14 days from your surgery.  Patient Signature:  ________________________________________________________  Nurse's Signature:  ________________________________________________________ 

## 2016-10-27 NOTE — H&P (View-Only) (Signed)
[name] is an 61 y.o. female.   Chief Complaint: Left knee pain HPI: The patient is a 61 year old female who presents today for follow up of their knee. The patient is being followed for their left knee pain. They are now month(s) out from when symptoms began. Symptoms reported today include: pain. Current treatment includes: physical therapy, home exercise program and activity modification. The following medication has been used for pain control: none. The patient presents today following physical therapy.  [name] reports pain in the knee, worse with activity, better with rest. She has persistent pain on the medial joint line and swelling. Occasionally, it will feel like it is giving way on her. She has tried cortisone injections and physical therapy.  Past Medical History:  Diagnosis Date  . Depression   . Fibroid   . H/O seasonal allergies   . Hearing impaired    bilaterally-wears hearing aids"hereditary""reads Lips"  . Heart murmur   . Hypercalcemia   . Insomnia   . OAB (overactive bladder)   . Vitamin D deficiency     Past Surgical History:  Procedure Laterality Date  . arm fracture Left    left wrist pinning and removal  . BROW LIFT AND BLEPHAROPLASTY Bilateral   . COLONOSCOPY    . EYE SURGERY    . PARATHYROIDECTOMY Right 06/18/2015   Procedure: RIGHT INFERIOR AND SUPERIOR PARATHYROIDECTOMY;  Surgeon: Armandina Gemma, MD;  Location: [facility];  Service: General;  Laterality: Right;  . THYROIDECTOMY Right 06/18/2015   Procedure: RIGHT LOBE THYROIDECTOMY;  Surgeon: Armandina Gemma, MD;  Location: [facility];  Service: General;  Laterality: Right;  . TONSILLECTOMY    . WISDOM TOOTH EXTRACTION      Family History  Problem Relation Age of Onset  . Osteoporosis Mother   . Hypertension Mother   . Diabetes Mother   . Breast cancer Sister 72    d/c 2011 BRCA1/2 NEG  . Breast cancer Maternal Aunt    Social History:  reports that she has quit smoking. Her smoking use included  Cigarettes. She has a 0.50 pack-year smoking history. She has never used smokeless tobacco. She reports that she drinks alcohol. She reports that she does not use drugs.  Allergies: No Known Allergies   (Not in a hospital admission)  No results found for this or any previous visit (from the past 48 hour(s)). No results found.  Review of Systems  Constitutional: Negative.   HENT: Negative.   Eyes: Negative.   Respiratory: Negative.   Cardiovascular: Negative.   Gastrointestinal: Negative.   Genitourinary: Negative.   Musculoskeletal: Positive for joint pain.  Skin: Negative.   Neurological: Negative.     Last menstrual period 07/24/2011. Physical Exam  Constitutional: She is oriented to person, place, and time. She appears well-developed.  HENT:  Head: Normocephalic.  Eyes: Pupils are equal, round, and reactive to light.  Neck: Normal range of motion.  Cardiovascular: Normal rate.   Respiratory: Effort normal.  GI: Soft.  Musculoskeletal:  On exam, tender medial joint line, has a McMurray. Patellofemoral pain to compression. Lacks 10 degrees of flexion. Knee exam on inspection reveals no evidence of soft tissue swelling, ecchymosis, deformity or erythema. On palpation there is no tenderness in the medial and lateral joint line. No patellofemoral pain with compression. Nontender over the fibular head or the peroneal nerve. Nontender over the quadriceps insertion of the patellar ligament insertion. The range of motion was full. Provocative maneuvers revealed a negative Lachman,  negative anterior and posterior drawer and a negative McMurray. No instability was noted with varus and valgus stressing at 0 or 30 degrees. On manual motor test the quadriceps and hamstrings were 5/5. Sensory exam was intact to light touch.  Neurological: She is alert and oriented to person, place, and time.  Skin: Skin is warm and dry.      X-rays demonstrates minimal medial joint space narrowing.  MRI  demonstrates a large joint effusion, radial tear of the posterior horn of the medial meniscus and some chondromalacia of the patellofemoral joint, also of the medial compartment.  Assessment/Plan Persistent knee pain despite rest, activity modification, home exercise program and injection with MRI indicating radial tear of the meniscus. She does have some mechanical symptoms, a feeling of giving way. She has reduced her level of activity.  We discussed options of living with her symptoms, repeat injection, continued activity modification, exercise program versus knee arthroscopy. Mechanical symptoms of fusion. Discussed knee arthroscopy evaluating the meniscus, partial meniscectomy, evaluating the chondral service, performing chondroplasties and a lavage. I had a long discussion with the patient concerning the risks and benefits of knee arthroscopy including help from the arthroscopic procedure as well as no help from the arthroscopic procedure or worsening of symptoms. Also discussed infection, DVT, PE, anesthetic complications, etc. Also discussed the possibility of repeat arthroscopic surgery required in the future or total knee replacement. I provided the patient with an illustrated handout and discussed it in detail as well as discussed the postoperative and perioperative courses and return to functional activities including work. Need for postoperative DVT prophylaxis was discussed as well. Following that, we discussed the postoperative course in detail and for residual symptoms, we discussed viscosupplementation. We also indicated we could not reverse arthritic changes. She understands. Again, with persistent symptoms, we will proceed.  Plan left knee arthroscopy, partial medial meniscectomy, debridement  Cecilie Kicks., PA-C for Dr. Tonita Cong 10/03/2016, 8:37 AM

## 2016-11-07 IMAGING — US US THYROID BIOPSY
1 series · 13 of 24 positions shown · non-contrast
Comparison: Ultrasound done on 03/31/2015

CLINICAL DATA: Bilateral complex thyroid nodules, dominant nodule
on the right measures 3.2 cm and the left 4 cm. Request for
ultrasound guided fine needle aspirate biopsy

EXAM:
ULTRASOUND GUIDED NEEDLE ASPIRATE BIOPSY OF THE THYROID GLAND

[Series 1: us thyroid biopsy · 0.08mm/px · 24 acquisitions, 13 frames shown]
[im 1/24]
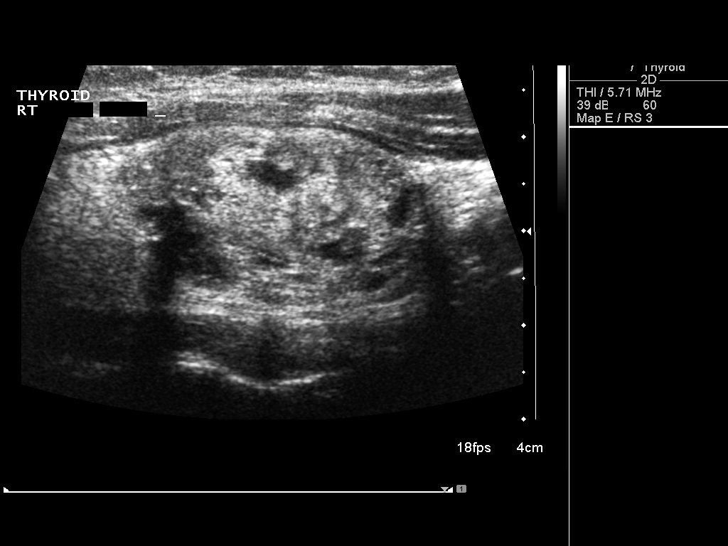
[im 3/24]
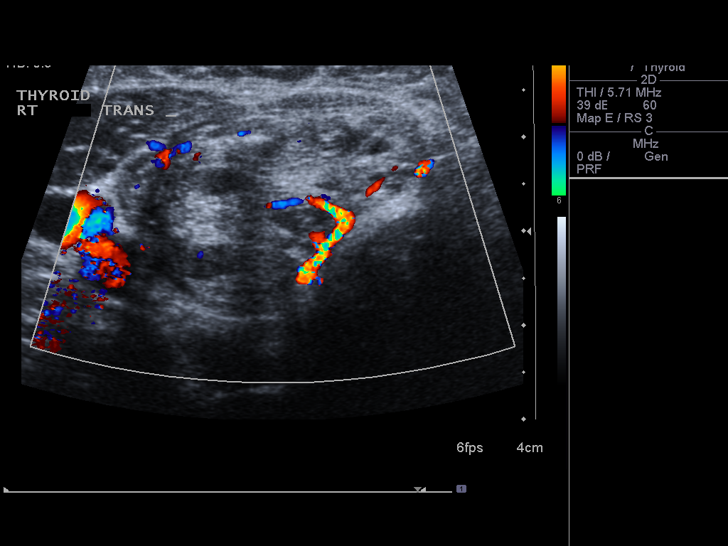
[im 5/24]
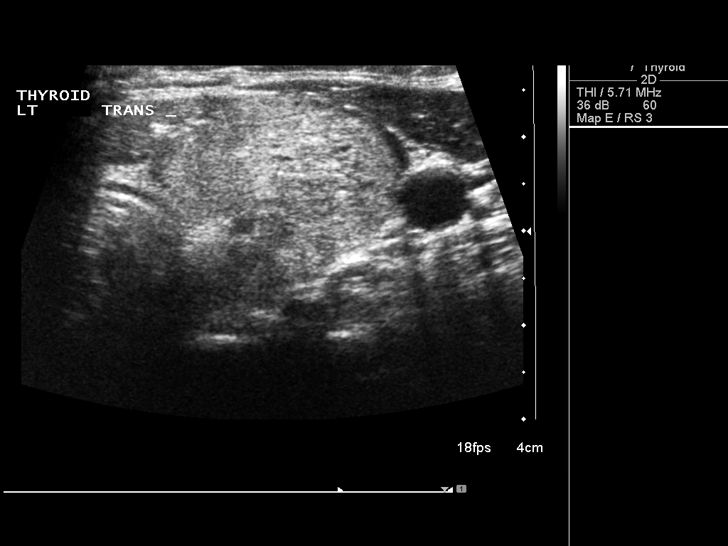
[im 7/24]
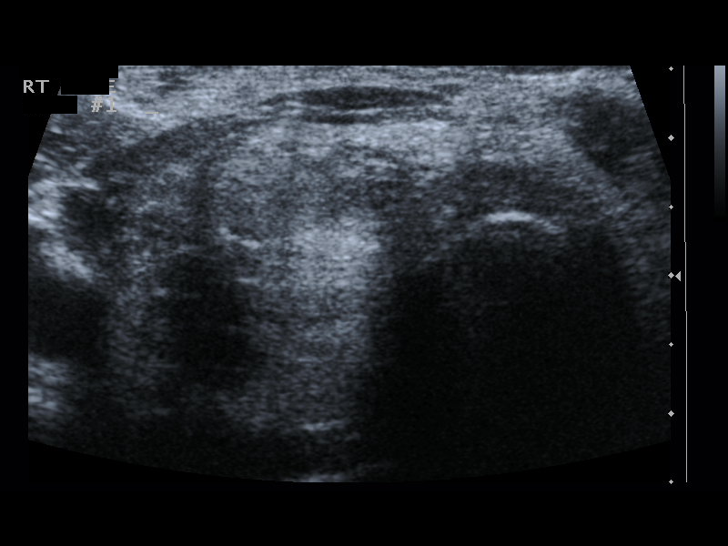
[im 9/24]
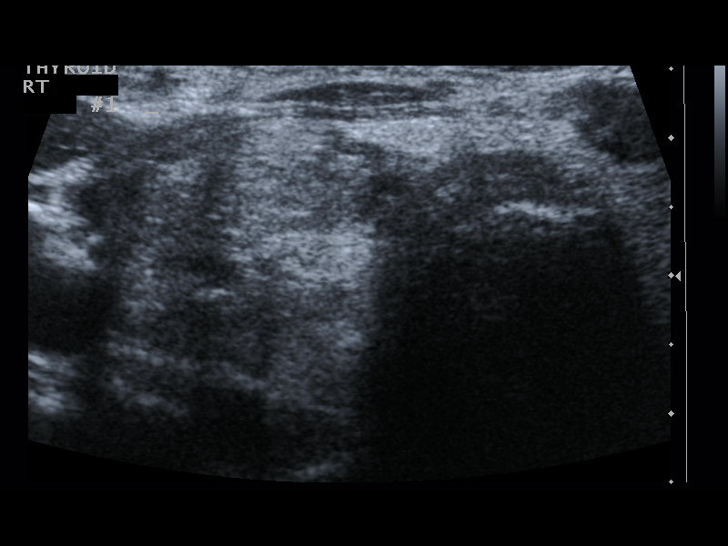
[im 11/24]
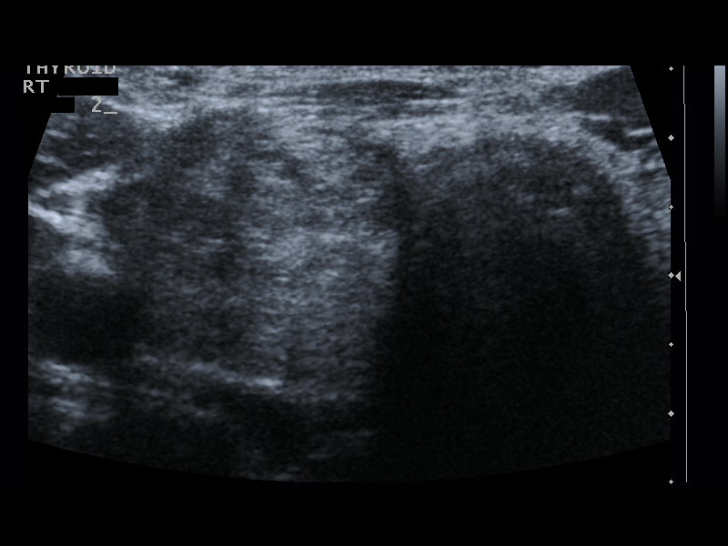
[im 13/24]
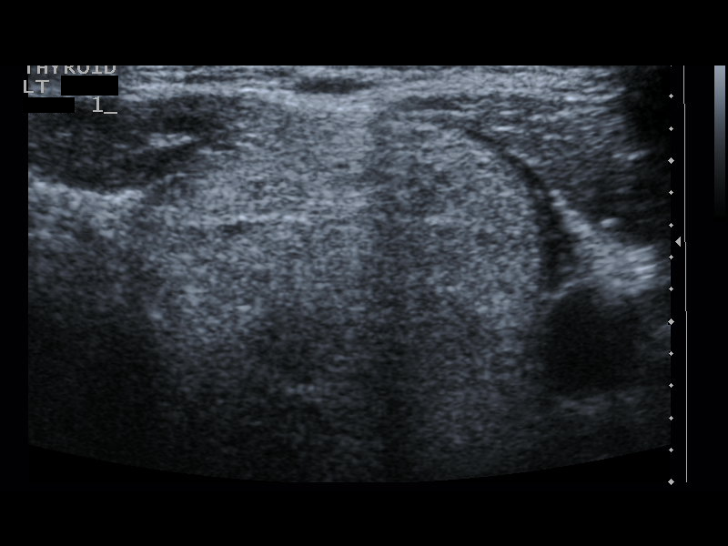
[im 14/24]
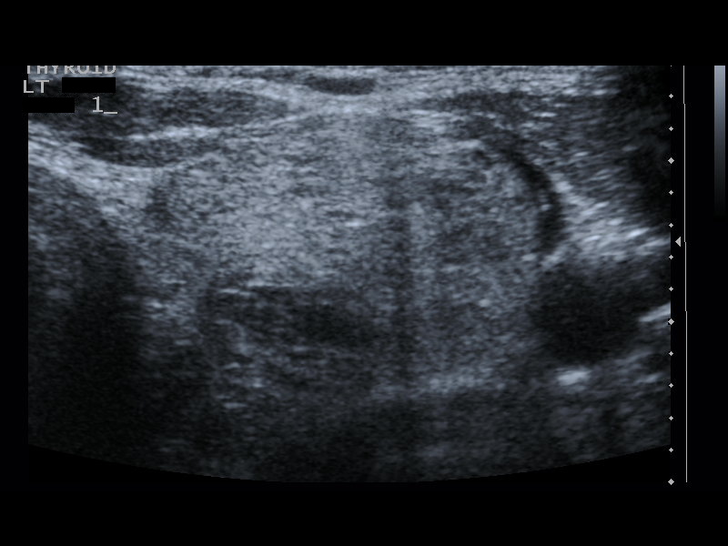
[im 16/24]
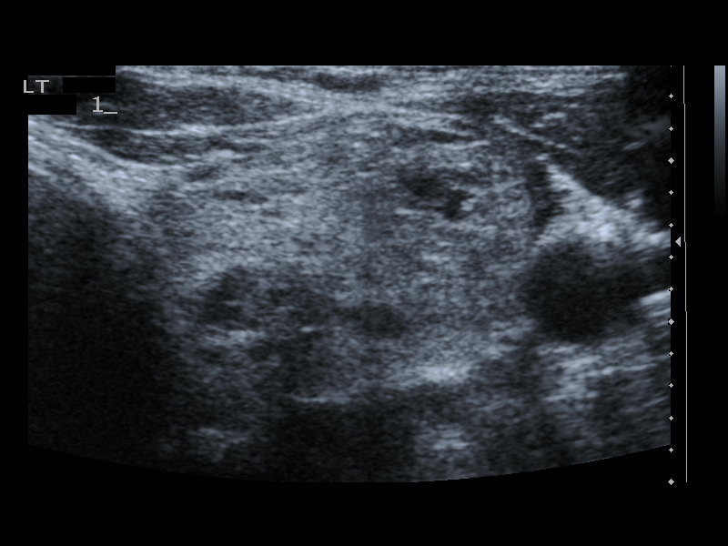
[im 18/24]
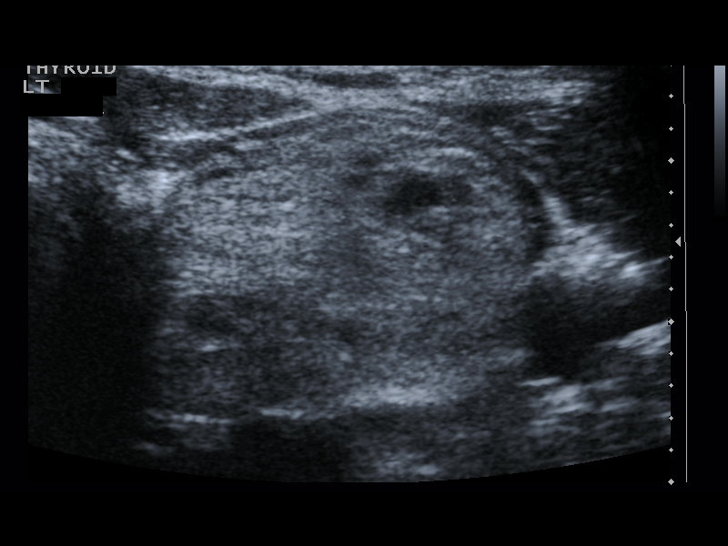
[im 20/24]
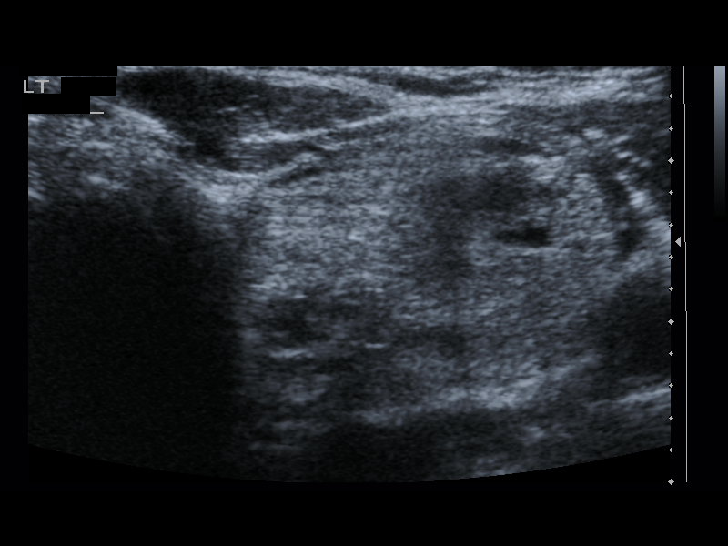
[im 22/24]
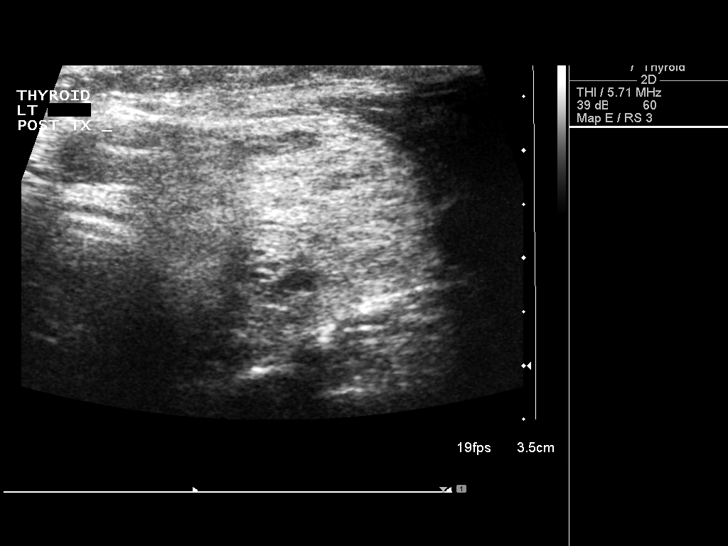
[im 24/24]
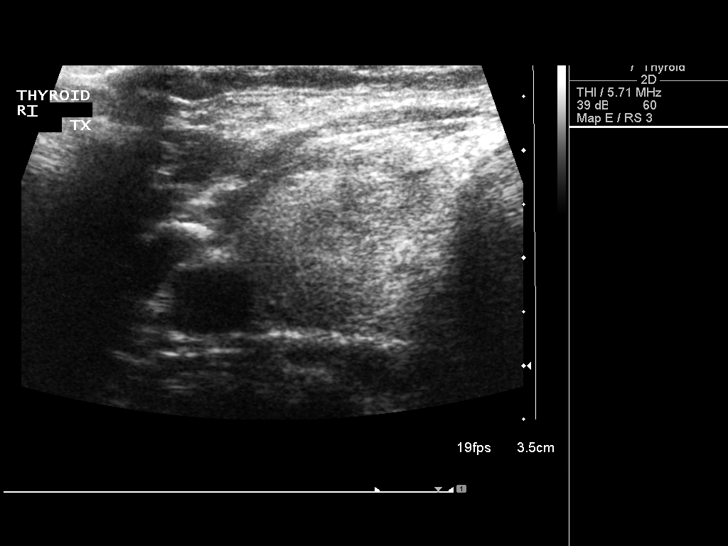

[13 of 24 positions shown; findings below may reference images not displayed]

PROCEDURE:
Thyroid biopsy was thoroughly discussed with the patient and
questions were answered. The benefits, risks, alternatives, and
complications were also discussed. The patient understands and
wishes to proceed with the procedure. Written consent was obtained.

Ultrasound was performed to localize and mark an adequate site for
the biopsy. The patient was then prepped and draped in a normal
sterile fashion. Local anesthesia was provided with 1% lidocaine.
Using direct ultrasound guidance, 3 passes were made using needles
into each nodule within the left and right lobe of the thyroid.
Ultrasound was used to confirm needle placements on all occasions.
Specimens were sent to Pathology for analysis.

Complications:  None immediate
FINDINGS: Imaging confirms needle placement into the dominant left and right
thyroid nodule.
IMPRESSION: Ultrasound guided needle aspirate biopsy performed of the left and
right thyroid nodule.

## 2016-11-08 IMAGING — NM NM PARATHYROID W/ SPECT
5 series · 20 of 20 positions shown · non-contrast
Comparison: 04/07/2015

CLINICAL DATA: Hypercalcemia.  Evaluate for parathyroid adenoma

EXAM:
NM PARATHYROID SCINTIGRAPHY AND SPECT IMAGING
TECHNIQUE: Following intravenous administration of radiopharmaceutical, early
and 2-hour delayed planar images were obtained in the anterior
projection. Delayed triplanar SPECT images were also obtained at 2
hours.
RADIOPHARMACEUTICALS:  26.0 mCi Lc-88m Sestamibi IV

[Series 1: spect - (id)_(id)_tra · 4.1mm · 4.14mm/px · 6 of 128 frames shown]
[frame 11/128]
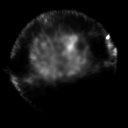
[frame 32/128]
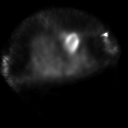
[frame 54/128]
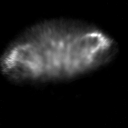
[frame 75/128]
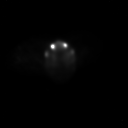
[frame 96/128]
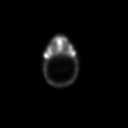
[frame 118/128]
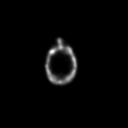

[Series 1: spect - (id)_(id)_cor · 4.1mm · 4.14mm/px · 6 of 128 frames shown]
[frame 11/128]
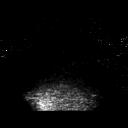
[frame 32/128]
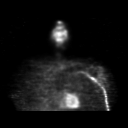
[frame 54/128]
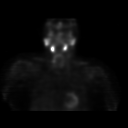
[frame 75/128]
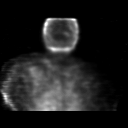
[frame 96/128]
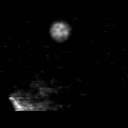
[frame 118/128]
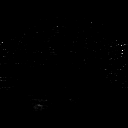

[Series 2: 15 min ant · 2.07mm/px · 1 of 1 slices shown]
[im 1/1]
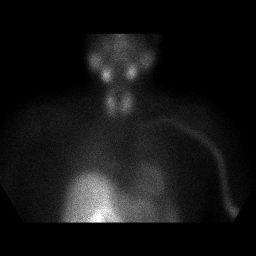

[Series 3: 2 hr ant · 2.07mm/px · 1 of 1 slices shown]
[im 1/1]
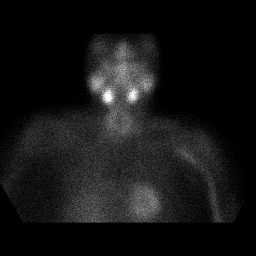

[Series 4: spect parathyroid · 4.14mm/px · 6 of 64 frames shown]
[frame 6/64  full-range]
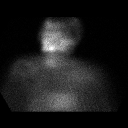
[frame 16/64  full-range]
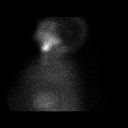
[frame 27/64  full-range]
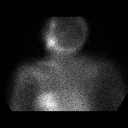
[frame 38/64  full-range]
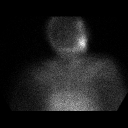
[frame 48/64  full-range]
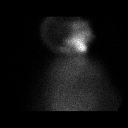
[frame 59/64  full-range]
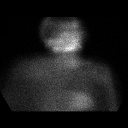

[20 of 20 positions shown; findings below may reference images not displayed]

FINDINGS: On the early images there is uniform tracer uptake within both lobes
of the thyroid gland. On the delayed images there is washout of the
radiopharmaceutical from both lobes of the thyroid gland. Seen on
the SPECT images only is a subtle focus of mild persistent increased
uptake localizing to the area of the inferior pole of right lobe of
thyroid gland
IMPRESSION: 1. Persistent focus of mildly increased uptake localizing to the
right lobe of thyroid gland is identified on the SPECT images only.
Findings are equivocal for aparathyroid adenoma localizing to the
area around the inferior pole of right lobe of thyroid gland.

## 2016-12-09 NOTE — Addendum Note (Signed)
Addendum  created 12/09/16 1400 by Rica Koyanagi, MD   Sign clinical note

## 2016-12-12 ENCOUNTER — Encounter (HOSPITAL_COMMUNITY): Payer: Self-pay | Admitting: Specialist

## 2016-12-12 NOTE — Addendum Note (Signed)
Addendum  created 12/12/16 1429 by Rica Koyanagi, MD   Anesthesia Event edited, Anesthesia Staff edited

## 2017-01-31 ENCOUNTER — Ambulatory Visit (HOSPITAL_COMMUNITY)
Admission: EM | Admit: 2017-01-31 | Discharge: 2017-01-31 | Disposition: A | Discharge status: Other Acute Inpatient Hospital | Payer: Commercial Managed Care - PPO

## 2017-02-08 ENCOUNTER — Ambulatory Visit
Admission: RE | Admit: 2017-02-08 | Discharge: 2017-02-08 | Disposition: A | Discharge status: Home / Self Care | Payer: Commercial Managed Care - PPO | Source: Ambulatory Visit | Attending: Family Medicine | Admitting: Family Medicine

## 2017-02-08 ENCOUNTER — Other Ambulatory Visit: Payer: Self-pay | Admitting: Family Medicine

## 2017-02-08 DIAGNOSIS — R079 Chest pain, unspecified: Secondary | ICD-10-CM

## 2017-02-24 ENCOUNTER — Telehealth: Payer: Self-pay | Admitting: Cardiology

## 2017-02-24 NOTE — Telephone Encounter (Signed)
Received records from [place] for appointment on 03/14/17 with Dr Ellyn Hack.  Records put with Dr Allison Quarry schedule for 03/14/17. lp

## 2017-03-14 ENCOUNTER — Ambulatory Visit (INDEPENDENT_AMBULATORY_CARE_PROVIDER_SITE_OTHER): Payer: Commercial Managed Care - PPO | Admitting: Cardiology

## 2017-03-14 DIAGNOSIS — R002 Palpitations: Secondary | ICD-10-CM | POA: Diagnosis not present

## 2017-03-14 DIAGNOSIS — R079 Chest pain, unspecified: Secondary | ICD-10-CM | POA: Diagnosis not present

## 2017-03-14 DIAGNOSIS — R011 Cardiac murmur, unspecified: Secondary | ICD-10-CM | POA: Diagnosis not present

## 2017-03-14 NOTE — Patient Instructions (Signed)
SCHEDULE AT [place] has requested that you have an echocardiogram. Echocardiography is a painless test that uses sound waves to create images of your heart. It provides your doctor with information about the size and shape of your heart and how well your heart's chambers and valves are working. This procedure takes approximately one hour. There are no restrictions for this procedure.   Your physician has recommended that you wear an event monitor 14 DAYS. Event monitors are medical devices that record the heart's electrical activity. Doctors most often [place] these monitors to diagnose arrhythmias. Arrhythmias are problems with the speed or rhythm of the heartbeat. The monitor is a small, portable device. You can wear one while you do your normal daily activities. This is usually used to diagnose what is causing palpitations/syncope (passing out).    NO CHANGE WITH CURRENT MEDICATIONS   Your physician recommends that you schedule a follow-up appointment in 2 [place].

## 2017-03-14 NOTE — Progress Notes (Signed)
PCP: Darcus Austin, MD / London Pepper, MD  Clinic Note: Chief Complaint  Patient presents with  . Chest Pain    pt c/o fasting heart rate and heart pounding  . Shortness of Breath    pt c/o SOB    HPI: [name] is a 61 y.o. female who is being seen today for the evaluation of intermittent chest pain and racing heart for 3 weeks at the request of London Pepper, MD. She is s/p partial thyroidectomy &parathyroidectomy back in 2016 - she is concerned that her Sx are related to her thyroid condition.  [name] was last seen on July 26 by Dr. Orland Mustard - noted CP & palpitations.  Recent Hospitalizations: Did not go to the Urgent Care 7/24  Studies Personally Reviewed - (if available, images/films reviewed: From Epic Chart or Care Everywhere)  Chest x-ray 02/08/2017:no acute cardiopulmonary abnormality. Thoracic aortic atherosclerosis noted  Interval History: She has noted episodes of of rapid HR -- described as feeling her heart racing (quivering / pounding hard & fast) & feeling it bearing in her ears.  At first the episodes occurred at night & woke her up from sleep feeling as though she could not catch her breath.  She would sit up in bed in order to catch her breath.  After that, she would have episodes occurring randomly at any time during the day & not associated with any particular activity.  If episodes occurred when she was waking, she did note that she would be short of breath & if she kept going, it got worse - better with rest.  She really does not note any chest pain per se, but notes it as more of a "discomfort" only with the palpitations.  The discomfort was L sided chest & resolves when the rapid HR stops.  Spells can last up to 5-10 minutes & usually spontaneously resolve.  Taking deep prolonged breaths tends to help, maybe not necessarily stopping heart palpitations, but helping her breathing.. She may feel dizzy if standing, but has not noted any near syncope or  syncope.  Oftentimes, simply changing position will help alleviate the palpitations  He started thinking that her episodes were related to having changed her thyroid dosage recently.  Symptoms seem to have started after adjusting her Synthroid dose -- she "took it upon herself" to adjust her dose back down  (& stop altogether) & notes that the episodes are occurring less frequently & lasting less time.   She was having the episodes almost every day, but now is maybe having episodes 3 times a week.  In the absence of these episodes, she denies chest pain or shortness of breath with rest or exertion.  No PND, orthopnea or edema. No weakness or syncope/near syncope.  She has been noticing some episodes of dizziness & poor balance - no falls. No TIA/amaurosis fugax symptoms. No melena, hematochezia, hematuria, or epstaxis. No claudication.  She indicates that she has been trying to adjust her diet & started exercising - hoping to loose ~30 lb.  ROS: A comprehensive was performed. Review of Systems  Constitutional: Negative for chills, fever and malaise/fatigue.  HENT: Negative for nosebleeds.   Respiratory: Negative for shortness of breath.   Cardiovascular: Negative for claudication.  Gastrointestinal: Negative for abdominal pain, blood in stool and melena.  Genitourinary: Negative for hematuria.  Musculoskeletal: Positive for joint pain (left knee surgery in April 2018). Negative for back pain, falls and neck pain.  Neurological: Positive for  dizziness. Negative for focal weakness.  Endo/Heme/Allergies: Negative for environmental allergies.  All other systems reviewed and are negative.   I have reviewed and (if needed) personally updated the patient's problem list, medications, allergies, past medical and surgical history, social and family history.   Past Medical History:  Diagnosis Date  . Bilateral hearing loss    Has Hearing aids  . Complication of anesthesia    " i usually come  out of it swinging"  . Depression    Dr. Caprice Beaver  . Fibroid   . H/O seasonal allergies   . Hearing impaired    bilaterally-wears hearing aids"hereditary""reads Lips"  . Heart murmur   . Hx of colonic polyps    Dr. Penelope Coop  . Hypercalcemia    from Hyperparathyroidism - now s/p Parathyroidectomy.  . Hypercholesteremia   . Hyperparathyroidism, unspecified ([place]) 06/2015   s/p parathyroidectomy &partial thyroidectomy.  . 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 Insomnia   . OAB (overactive bladder)   . Prediabetes 09/2015  . Vitamin D deficiency   . Vitamin D deficiency     Past Surgical History:  Procedure Laterality Date  . arm fracture Left    left wrist pinning and removal  . BROW LIFT AND BLEPHAROPLASTY Bilateral   . COLONOSCOPY    . EYE SURGERY     blepharoplasty bilateral ; upper and lower lids  . KNEE ARTHROSCOPY WITH MEDIAL MENISECTOMY Left 10/27/2016   Procedure: LEFT KNEE ARTHROSCOPY, PARTIAL MEDIAL MENISECTOMY, DEBRIDEMENT;  Surgeon: Susa Day, MD;  Location: [facility];  Service: Orthopedics;  Laterality: Left;  . PARATHYROIDECTOMY Right 06/18/2015   Procedure: RIGHT INFERIOR AND SUPERIOR PARATHYROIDECTOMY;  Surgeon: Armandina Gemma, MD;  Location: [facility];  Service: General;  Laterality: Right;  . THYROIDECTOMY Right 06/18/2015   Procedure: RIGHT LOBE THYROIDECTOMY;  Surgeon: Armandina Gemma, MD;  Location: [facility];  Service: General;  Laterality: Right;  . TONSILLECTOMY    . WISDOM TOOTH EXTRACTION      Current Meds  Medication Sig  . Eszopiclone (ESZOPICLONE) 3 MG TABS Take 3 mg by mouth at bedtime.   . sertraline (ZOLOFT) 50 MG tablet Take 1 tablet by mouth at bedtime.  . Vitamin D, Ergocalciferol, (DRISDOL) 50000 UNITS CAPS Take 50,000 Units by mouth 2 (two) times a week. Tuesdays and Thursdays  . [DISCONTINUED] levothyroxine (SYNTHROID, LEVOTHROID) 50 MCG tablet Take 50 mcg by mouth daily before breakfast.    Allergies  Allergen Reactions  .  Demerol [Meperidine]     "I became delirious "    Social History   Social History  . Marital status: Married    Spouse name: N/A  . Number of children: N/A  . Years of education: N/A   Occupational History  . Network engineer   Social History Main Topics  . Smoking status: Former Smoker    Packs/day: 0.25    Years: 2.00    Types: Cigarettes    Quit date: 03/15/1990  . Smokeless tobacco: Never Used  . Alcohol use Yes     Comment: rare  . Drug use: No  . Sexual activity: Yes     Comment: VAS   Other Topics Concern  . None   Social History Narrative   Married - mother of 1 son.       Coffee - 1-2 x day, occasional tea. Rare soda (< 2 / month).   Distant history of Marijuana use.   Passive smoke exposure -  husband      Has started trying to exercise more - ~up to 1 hr /day (most days).  Also uses stairs & parks far away from stores.    family history includes Breast cancer in her maternal aunt; Breast cancer (age of onset: 7) in her sister; COPD in her mother and sister; Dementia in her paternal grandfather; Diabetes in her mother; Emphysema in her maternal grandfather; Hypertension in her mother; Osteoporosis in her mother.  Wt Readings from Last 3 Encounters:  03/14/17 186 lb 12.8 oz (84.7 kg)  10/27/16 184 lb (83.5 kg)  10/19/16 184 lb 12.8 oz (83.8 kg)    PHYSICAL EXAM BP 132/86   Pulse 73   Ht 5\' 3"  (1.6 m)   Wt 186 lb 12.8 oz (84.7 kg)   LMP 07/24/2011   BMI 33.09 kg/m  Physical Exam  Constitutional: She is oriented to person, place, and time. She appears well-developed and well-nourished. No distress.  WELL-GROOMED  HENT:  Head: Normocephalic and atraumatic.  Mouth/Throat: No oropharyngeal exudate.  Eyes: Pupils are equal, round, and reactive to light. Conjunctivae and EOM are normal. No scleral icterus.  Neck: Normal range of motion. Neck supple. No hepatojugular reflux and no JVD present. Carotid bruit is not present. No  tracheal deviation present. Thyromegaly (slightly palpable left thyroid nodule) present.  Cardiovascular: Normal rate, regular rhythm and intact distal pulses.  Exam reveals no gallop and no friction rub.   Murmur (1/6 SM @ RUSB) heard. Pulmonary/Chest: Effort normal and breath sounds normal. No respiratory distress. She has no wheezes. She has no rales. She exhibits no tenderness.  Abdominal: Soft. Bowel sounds are normal. She exhibits no distension. There is no tenderness. There is no rebound and no guarding.  OBESE  Lymphadenopathy:    She has no cervical adenopathy.  Neurological: She is alert and oriented to person, place, and time. No cranial nerve deficit.  Skin: Skin is warm and dry. No rash noted. No erythema. No pallor.  Psychiatric: She has a normal mood and affect. Her behavior is normal. Judgment and thought content normal.  Nursing note and vitals reviewed.    Adult ECG Report  Rate: 73 ;  Rhythm: normal sinus rhythm and normal axis, intervals and durations;   Narrative Interpretation: normal EKG  - form PCP SR 75. Normal Axis, intervals & durations  Other studies Reviewed: Additional studies/ records that were reviewed today include:  Recent Labs:  From PCPs note 02/03/2017:   Na+ 139, K+ 4.7, Cl- 102, CO2 29, BUN/Cr 10/0.63, Glu 111, Ca ++ 9.8. AST/ALT/AP 17/18/75.  CBC: W 7.6, H/H 15.3/46, Plt 303;  TSH 0.9  No results found for: CHOL, HDL, LDLCALC, LDLDIRECT, TRIG, CHOLHDL   ASSESSMENT / PLAN: Problem List Items Addressed This Visit    Chest pain with moderate risk for cardiac etiology    Not truly chest pain, more like chest discomfort from palpitations. First we will check a 2-D echocardiogram and a monitor to determine if she is indeed having any arrhythmias (i.e. SVT or Afib), if she is still noting discomfort, could consider GXT or CPX for CV risk stratification.      Relevant Orders   EKG 12-Lead (Completed)   ECHOCARDIOGRAM COMPLETE   Cardiac event  monitor   Heart murmur, systolic     She does have what sounds like a soft, subtle murmur that is probably benign. However in light of her having episodes of palpitations that are somewhat symptomatic, we will check a 2-D  echocardiogram to exclude any structural abnormalities.      Relevant Orders   ECHOCARDIOGRAM COMPLETE   Cardiac event monitor   Heart palpitations (Chronic)    Rapid heart palpitations - maybe associated with adjusted thyroid Rx.  Self limiting episodes of forceful heart beats - cannot exclude SVT/PAT or even frequent PAC/PVCs with bi or trigeminy.    Plan: Cardiac Event Monitor - at least 2 weeks worth in order to capture several episodes, now that the are not as frequent.      Relevant Orders   EKG 12-Lead (Completed)   ECHOCARDIOGRAM COMPLETE   Cardiac event monitor      Current medicines are reviewed at length with the patient today. (+/- concerns) none The following changes have been made: none  Patient Instructions  SCHEDULE AT [facility] has requested that you have an echocardiogram. Echocardiography is a painless test that uses sound waves to create images of your heart. It provides your doctor with information about the size and shape of your heart and how well your heart's chambers and valves are working. This procedure takes approximately one hour. There are no restrictions for this procedure.   Your physician has recommended that you wear an event monitor 14 DAYS. Event monitors are medical devices that record the heart's electrical activity. Doctors most often [place] these monitors to diagnose arrhythmias. Arrhythmias are problems with the speed or rhythm of the heartbeat. The monitor is a small, portable device. You can wear one while you do your normal daily activities. This is usually used to diagnose what is causing palpitations/syncope (passing out).    NO CHANGE WITH CURRENT MEDICATIONS   Your physician  recommends that you schedule a follow-up appointment in 2 [place].    Studies Ordered:   Orders Placed This Encounter  Procedures  . Cardiac event monitor  . EKG 12-Lead  . ECHOCARDIOGRAM COMPLETE      Glenetta Hew, M.D., M.S. Interventional Cardiologist   Pager # [phone] Phone # [phone] [address] [place], [place] [address]

## 2017-03-15 ENCOUNTER — Encounter: Payer: Self-pay | Admitting: Cardiology

## 2017-03-15 NOTE — Assessment & Plan Note (Signed)
Not truly chest pain, more like chest discomfort from palpitations. First we will check a 2-D echocardiogram and a monitor to determine if she is indeed having any arrhythmias (i.e. SVT or Afib), if she is still noting discomfort, could consider GXT or CPX for CV risk stratification.

## 2017-03-15 NOTE — Assessment & Plan Note (Signed)
Rapid heart palpitations - maybe associated with adjusted thyroid Rx.  Self limiting episodes of forceful heart beats - cannot exclude SVT/PAT or even frequent PAC/PVCs with bi or trigeminy.    Plan: Cardiac Event Monitor - at least 2 weeks worth in order to capture several episodes, now that the are not as frequent.

## 2017-03-15 NOTE — Assessment & Plan Note (Signed)
She does have what sounds like a soft, subtle murmur that is probably benign. However in light of her having episodes of palpitations that are somewhat symptomatic, we will check a 2-D echocardiogram to exclude any structural abnormalities.

## 2017-03-16 IMAGING — US US SOFT TISSUE HEAD/NECK
1 series · 14 of 25 positions shown · non-contrast
Comparison: None.

CLINICAL DATA: Thyroid enlargement on exam

EXAM:
THYROID ULTRASOUND
TECHNIQUE: Ultrasound examination of the thyroid gland and adjacent soft
tissues was performed.

[Series 1: us soft tissue head/neck · 0.07mm/px · 14 of 63 slices shown]
[im 1/63]
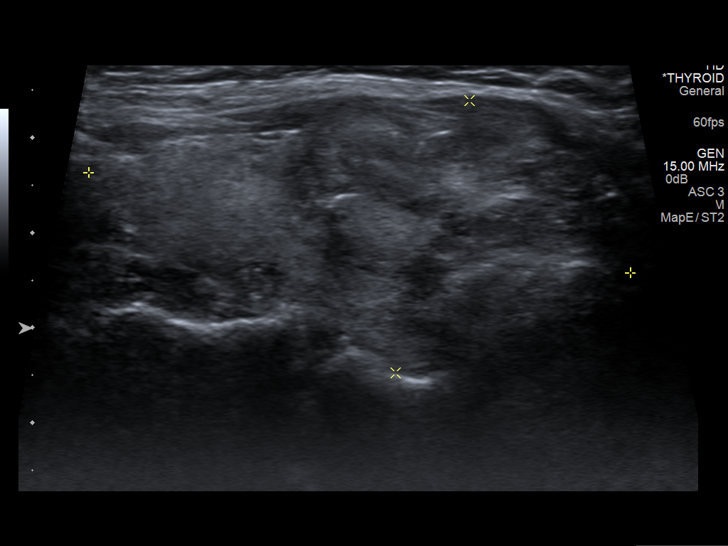
[im 6/63]
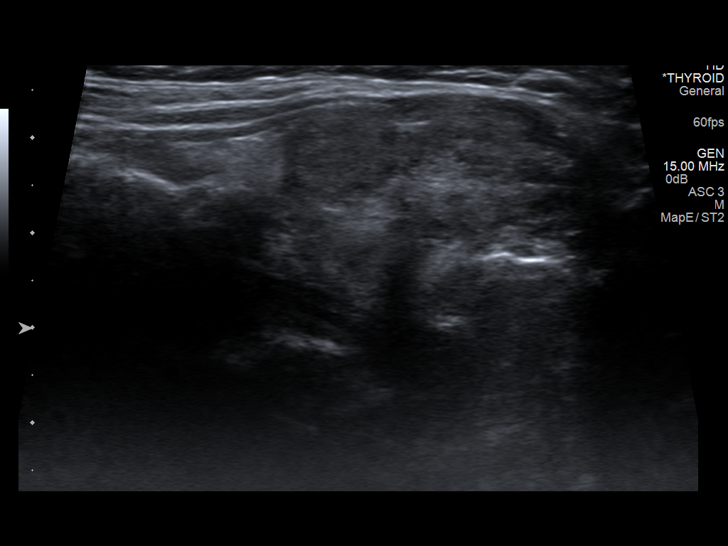
[im 11/63]
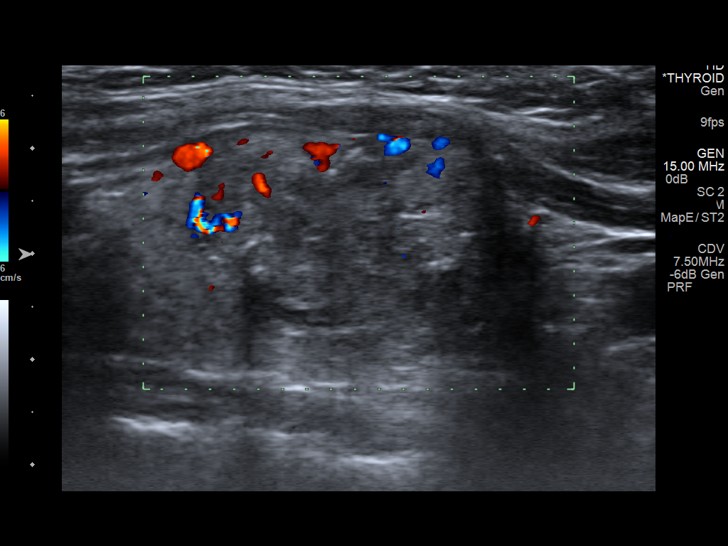
[im 16/63]
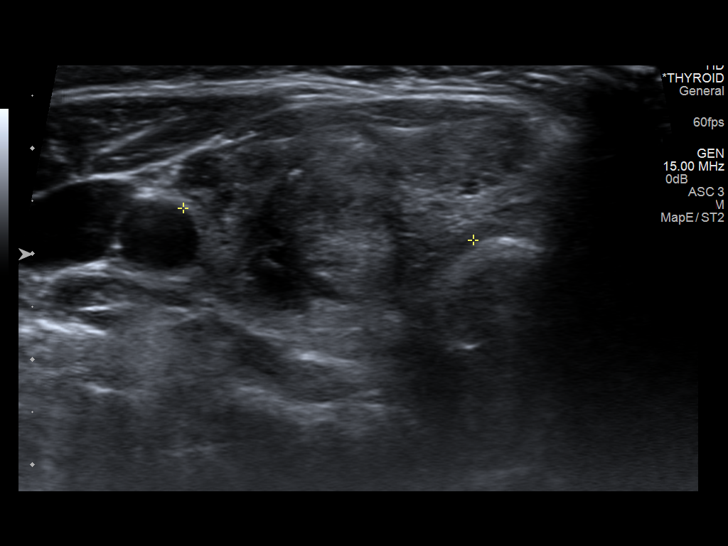
[im 21/63]
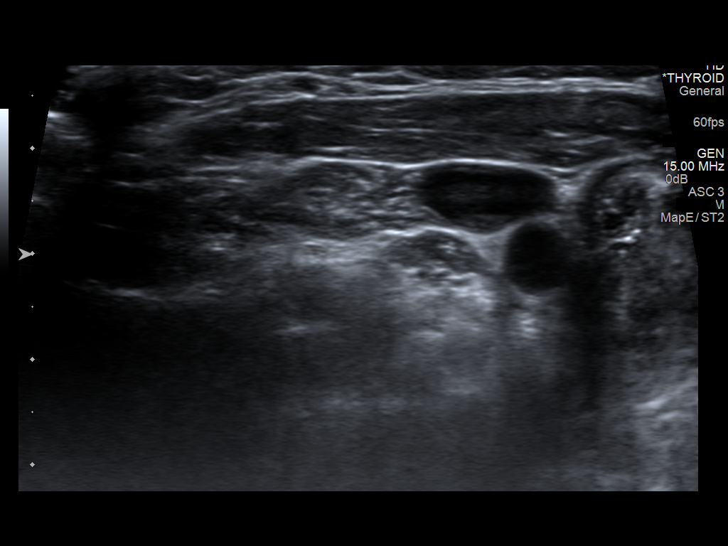
[im 24/63]
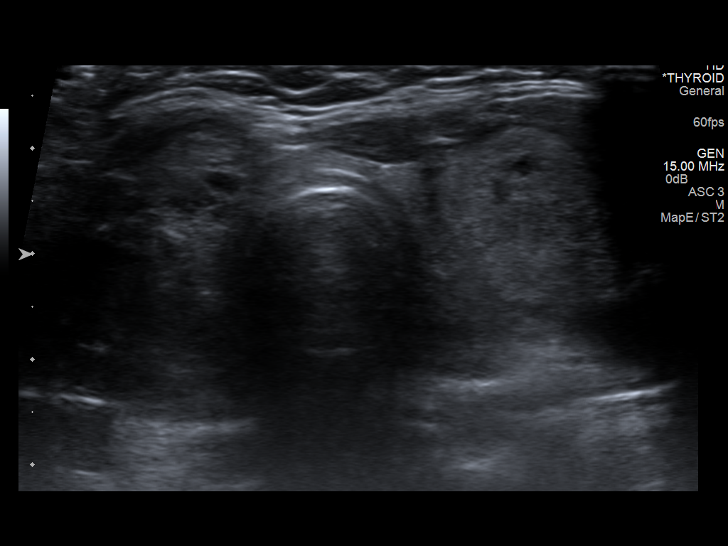
[im 29/63]
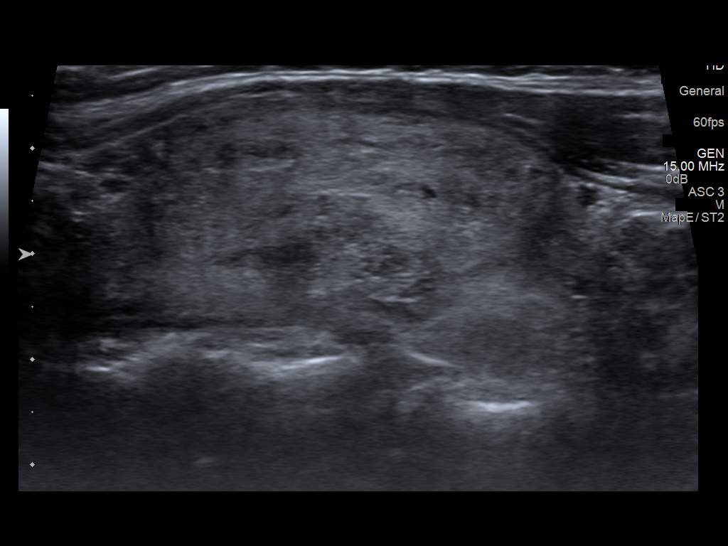
[im 34/63]
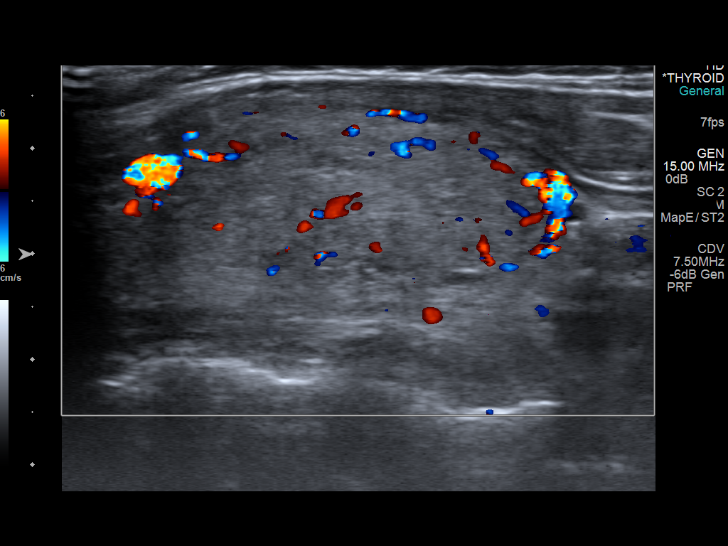
[im 39/63]
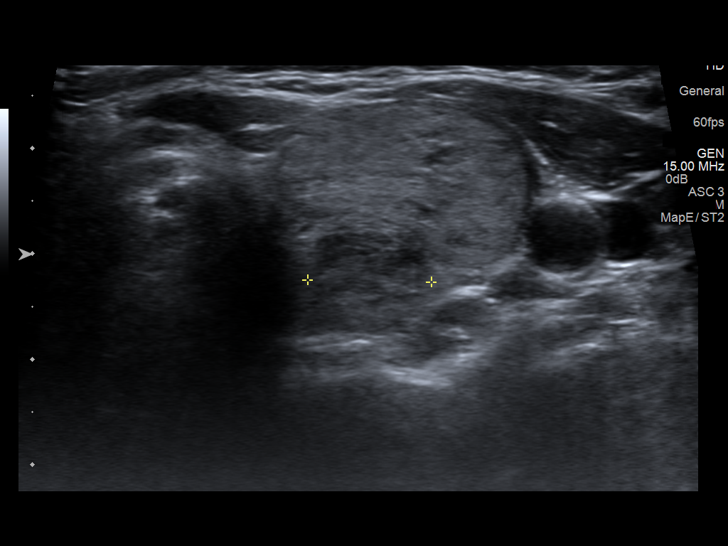
[im 42/63]
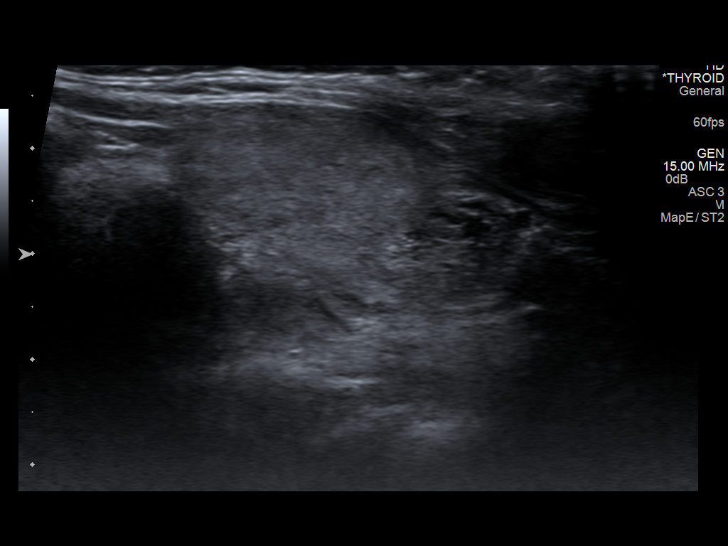
[im 47/63]
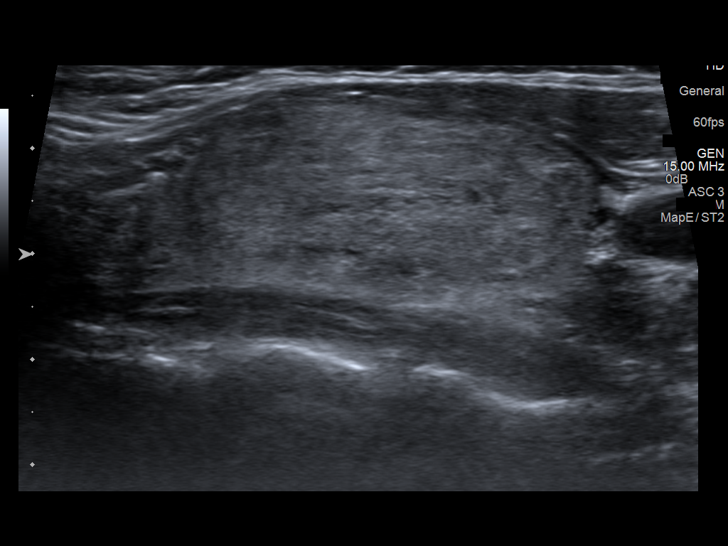
[im 52/63]
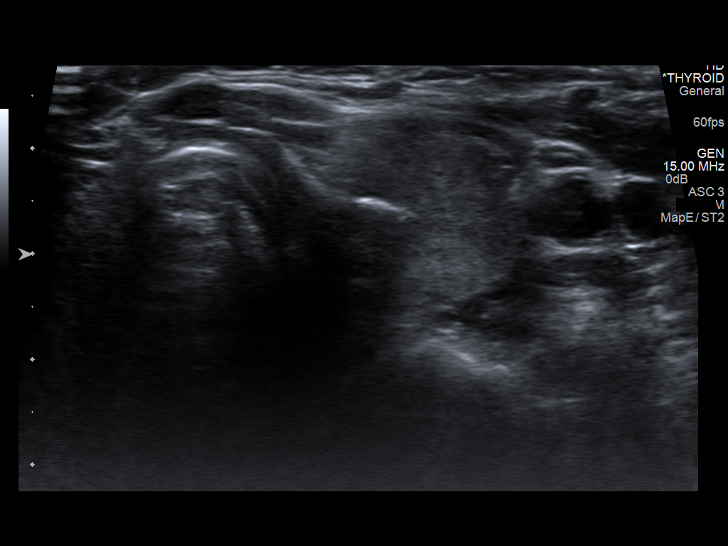
[im 57/63]
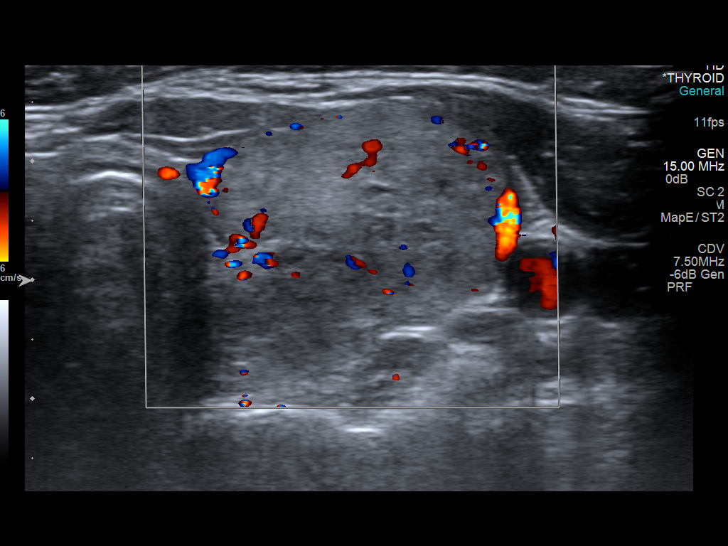
[im 63/63]
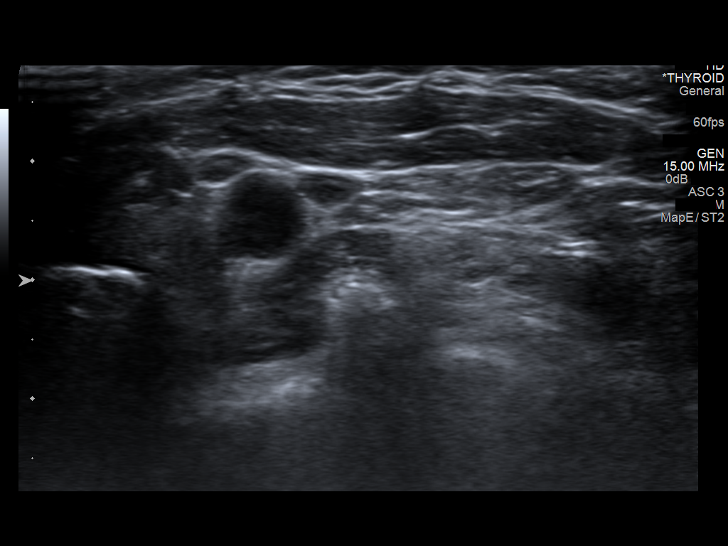

[14 of 25 positions shown; findings below may reference images not displayed]

FINDINGS: Right thyroid lobe

Measurements: 5.8 x 3.0 x 3.1 cm. Heterogeneous complex hypoechoic
right thyroid nodule measures 3.2 x 2.4 x 2.8 cm. This involves the
mid and lower pole regions.

Left thyroid lobe

Measurements: 5.1 x 2.1 x 2.7 cm. Heterogeneous echogenic complex
left midpole thyroid mass measures 4.0 x 1.6 x 2.6 cm. Additional
hypoechoic left midpole nodule measures 2.1 x 1.5 x 1.2 cm.

Left inferior pole exophytic hypoechoic nodule measures 11 mm.

Isthmus

Thickness: 2 mm.  No nodules visualized.

Lymphadenopathy

None visualized.
IMPRESSION: Bilateral complex thyroid nodules, dominant nodule on the right
measures 3.2 cm and the left 4 cm.

Findings meet consensus criteria for biopsy. Ultrasound-guided fine
needle aspiration should be considered, as per the consensus
statement: Management of Thyroid Nodules Detected at US: Society of
Radiologists in Ultrasound Consensus Conference Statement. Radiology

## 2017-03-17 ENCOUNTER — Other Ambulatory Visit (HOSPITAL_COMMUNITY): Payer: Commercial Managed Care - PPO

## 2017-03-24 ENCOUNTER — Ambulatory Visit (HOSPITAL_COMMUNITY): Payer: Commercial Managed Care - PPO | Attending: Cardiovascular Disease

## 2017-05-11 DIAGNOSIS — E042 Nontoxic multinodular goiter: Secondary | ICD-10-CM | POA: Diagnosis not present

## 2017-05-11 DIAGNOSIS — R7303 Prediabetes: Secondary | ICD-10-CM | POA: Diagnosis not present

## 2017-05-11 DIAGNOSIS — E559 Vitamin D deficiency, unspecified: Secondary | ICD-10-CM | POA: Diagnosis not present

## 2017-05-11 DIAGNOSIS — E7800 Pure hypercholesterolemia, unspecified: Secondary | ICD-10-CM | POA: Diagnosis not present

## 2017-05-11 DIAGNOSIS — E039 Hypothyroidism, unspecified: Secondary | ICD-10-CM | POA: Diagnosis not present

## 2017-05-11 DIAGNOSIS — M8589 Other specified disorders of bone density and structure, multiple sites: Secondary | ICD-10-CM | POA: Diagnosis not present

## 2017-05-16 ENCOUNTER — Ambulatory Visit: Payer: Commercial Managed Care - PPO | Admitting: Cardiology

## 2017-05-17 DIAGNOSIS — E042 Nontoxic multinodular goiter: Secondary | ICD-10-CM | POA: Diagnosis not present

## 2017-05-17 DIAGNOSIS — R1310 Dysphagia, unspecified: Secondary | ICD-10-CM | POA: Diagnosis not present

## 2017-05-17 DIAGNOSIS — R490 Dysphonia: Secondary | ICD-10-CM | POA: Diagnosis not present

## 2017-05-19 ENCOUNTER — Other Ambulatory Visit: Payer: Self-pay | Admitting: Internal Medicine

## 2017-05-19 DIAGNOSIS — E042 Nontoxic multinodular goiter: Secondary | ICD-10-CM

## 2017-05-25 ENCOUNTER — Ambulatory Visit
Admission: RE | Admit: 2017-05-25 | Discharge: 2017-05-25 | Disposition: A | Discharge status: Home / Self Care | Payer: Commercial Managed Care - PPO | Source: Ambulatory Visit | Attending: Internal Medicine | Admitting: Internal Medicine

## 2017-05-25 DIAGNOSIS — E042 Nontoxic multinodular goiter: Secondary | ICD-10-CM | POA: Diagnosis not present

## 2017-07-21 DIAGNOSIS — E041 Nontoxic single thyroid nodule: Secondary | ICD-10-CM | POA: Diagnosis not present

## 2017-11-01 DIAGNOSIS — Z23 Encounter for immunization: Secondary | ICD-10-CM | POA: Diagnosis not present

## 2017-11-01 DIAGNOSIS — E559 Vitamin D deficiency, unspecified: Secondary | ICD-10-CM | POA: Diagnosis not present

## 2017-11-01 DIAGNOSIS — E7800 Pure hypercholesterolemia, unspecified: Secondary | ICD-10-CM | POA: Diagnosis not present

## 2017-11-01 DIAGNOSIS — Z0000 Encounter for general adult medical examination without abnormal findings: Secondary | ICD-10-CM | POA: Diagnosis not present

## 2017-11-01 DIAGNOSIS — E041 Nontoxic single thyroid nodule: Secondary | ICD-10-CM | POA: Diagnosis not present

## 2017-11-01 DIAGNOSIS — R7303 Prediabetes: Secondary | ICD-10-CM | POA: Diagnosis not present

## 2018-04-06 DIAGNOSIS — H903 Sensorineural hearing loss, bilateral: Secondary | ICD-10-CM | POA: Diagnosis not present

## 2018-05-03 DIAGNOSIS — Z23 Encounter for immunization: Secondary | ICD-10-CM | POA: Diagnosis not present

## 2018-05-03 DIAGNOSIS — E7800 Pure hypercholesterolemia, unspecified: Secondary | ICD-10-CM | POA: Diagnosis not present

## 2018-05-03 DIAGNOSIS — E669 Obesity, unspecified: Secondary | ICD-10-CM | POA: Diagnosis not present

## 2018-05-03 DIAGNOSIS — R7303 Prediabetes: Secondary | ICD-10-CM | POA: Diagnosis not present

## 2018-09-19 ENCOUNTER — Other Ambulatory Visit: Payer: Self-pay | Admitting: Surgery

## 2018-09-19 DIAGNOSIS — E042 Nontoxic multinodular goiter: Secondary | ICD-10-CM

## 2019-05-01 ENCOUNTER — Ambulatory Visit
Admission: RE | Admit: 2019-05-01 | Discharge: 2019-05-01 | Disposition: A | Discharge status: Home / Self Care | Payer: Commercial Managed Care - PPO | Source: Ambulatory Visit | Attending: Surgery | Admitting: Surgery

## 2019-05-01 DIAGNOSIS — E042 Nontoxic multinodular goiter: Secondary | ICD-10-CM

## 2020-04-27 ENCOUNTER — Other Ambulatory Visit: Payer: Self-pay | Admitting: Surgery

## 2020-04-27 DIAGNOSIS — E041 Nontoxic single thyroid nodule: Secondary | ICD-10-CM

## 2020-04-27 DIAGNOSIS — E042 Nontoxic multinodular goiter: Secondary | ICD-10-CM

## 2020-04-27 DIAGNOSIS — Z9009 Acquired absence of other part of head and neck: Secondary | ICD-10-CM

## 2020-05-25 ENCOUNTER — Ambulatory Visit
Admission: RE | Admit: 2020-05-25 | Discharge: 2020-05-25 | Disposition: A | Discharge status: Home / Self Care | Payer: Commercial Managed Care - PPO | Source: Ambulatory Visit | Attending: Surgery | Admitting: Surgery

## 2020-05-25 DIAGNOSIS — Z9009 Acquired absence of other part of head and neck: Secondary | ICD-10-CM

## 2020-05-25 DIAGNOSIS — E041 Nontoxic single thyroid nodule: Secondary | ICD-10-CM

## 2020-05-25 DIAGNOSIS — E042 Nontoxic multinodular goiter: Secondary | ICD-10-CM

## 2020-07-09 ENCOUNTER — Ambulatory Visit: Payer: Self-pay | Admitting: Surgery

## 2020-07-13 ENCOUNTER — Telehealth: Payer: Self-pay

## 2020-07-13 NOTE — Telephone Encounter (Signed)
[id number] 

## 2020-07-13 NOTE — Telephone Encounter (Signed)
   Primary Cardiologist: Previously seen once by Dr. Herbie Baltimore, though has not been seen since 2018. Patient will be a "new" patient and can be scheduled with any provider.   Chart reviewed as part of pre-operative protocol coverage. Because of [name]'s past medical history and time since last visit, she will require a follow-up visit in order to better assess preoperative cardiovascular risk.  Pre-op covering staff: - Please schedule appointment and call patient to inform them. Patient will be a "new" patient and can be scheduled with any provider given she only saw Dr. Herbie Baltimore once in 2018. Please add "pre-op clearance" to the appointment notes so provider is aware. - Please contact requesting surgeon's office via preferred method (i.e, phone, fax) to inform them of need for appointment prior to surgery.   Beatriz Stallion, PA-C  07/13/2020, 12:46 PM

## 2020-07-13 NOTE — Telephone Encounter (Signed)
   [facility] Pre-operative Risk Assessment    Request for surgical clearance:  1. What type of surgery is being performed? COMPLETION THYROIDECTOMY    2. When is this surgery scheduled? TBD   3. What type of clearance is required (medical clearance vs. Pharmacy clearance to hold med vs. Both)? MEDICAL  4. Are there any medications that need to be held prior to surgery and how long? NONE   5. Practice name and name of physician performing surgery? [facility] ATTN:KELLY   6. What is the office phone number? [phone]   7.   What is the office fax number? [phone]  8.   Anesthesia type (None, local, MAC, general) ? GENERAL

## 2020-07-15 NOTE — Telephone Encounter (Signed)
Will send message to our scheduling team to reach out to the pt with a New Pt appt since the pt was last seen 2018.

## 2023-02-28 ENCOUNTER — Other Ambulatory Visit (HOSPITAL_COMMUNITY): Payer: Self-pay | Admitting: Family Medicine

## 2023-02-28 DIAGNOSIS — E7800 Pure hypercholesterolemia, unspecified: Secondary | ICD-10-CM

## 2023-03-08 ENCOUNTER — Ambulatory Visit (HOSPITAL_COMMUNITY)
Admission: RE | Admit: 2023-03-08 | Discharge: 2023-03-08 | Disposition: A | Discharge status: Home / Self Care | Payer: Self-pay | Source: Ambulatory Visit | Attending: Family Medicine | Admitting: Family Medicine

## 2023-03-08 DIAGNOSIS — E7800 Pure hypercholesterolemia, unspecified: Secondary | ICD-10-CM | POA: Insufficient documentation

## 2023-07-06 ENCOUNTER — Ambulatory Visit
Admission: RE | Admit: 2023-07-06 | Discharge: 2023-07-06 | Disposition: A | Discharge status: Home / Self Care | Payer: Medicare Other | Source: Ambulatory Visit | Attending: Family Medicine | Admitting: Family Medicine

## 2023-07-06 ENCOUNTER — Other Ambulatory Visit: Payer: Self-pay | Admitting: Family Medicine

## 2023-07-06 DIAGNOSIS — E042 Nontoxic multinodular goiter: Secondary | ICD-10-CM

## 2023-07-22 LAB — EXTERNAL GENERIC LAB PROCEDURE: COLOGUARD: POSITIVE — AB

## 2023-07-22 LAB — COLOGUARD: COLOGUARD: POSITIVE — AB

## 2023-10-16 NOTE — Progress Notes (Unsigned)
  Cardiology Office Note:  .   Date:  10/17/2023  ID:  [name], DOB 1955/10/28, MRN [id number] PCP: Shaune Pollack, MD (Inactive)  [facility] Providers Cardiologist:  None    History of Present Illness: .   [name] is a 68 y.o. female with hyperliidemia   LDL is 231  Chol = 300 HDL = 46  Her CAC score is 0   No CP, no dyspnea Tries to walk several days a week   I encouraged her to continue to work on increasing exercise and work on weight loss     Going to [place] this summer    ROS:   Studies Reviewed: Marland Kitchen   EKG Interpretation Date/Time:  Tuesday October 17 2023 14:37:49 EDT Ventricular Rate:  76 PR Interval:  168 QRS Duration:  80 QT Interval:  366 QTC Calculation: 411 R Axis:   29  Text Interpretation: Normal sinus rhythm Cannot rule out Anterior infarct , age undetermined No previous ECGs available Confirmed by Kristeen Miss [phone]) on 10/17/2023 3:22:25 PM     Risk Assessment/Calculations:             Physical Exam:   VS:  BP 132/88   Pulse 93   Ht 5\' 3"  (1.6 m)   Wt 174 lb 3.2 oz (79 kg)   LMP 07/24/2011   SpO2 98%   BMI 30.86 kg/m    Wt Readings from Last 3 Encounters:  10/17/23 174 lb 3.2 oz (79 kg)  03/14/17 186 lb 12.8 oz (84.7 kg)  10/27/16 184 lb (83.5 kg)    GEN: moderatley obese female,  in no acute distress NECK: large left sided goiter ,   No carotid bruits CARDIAC: RRR, no murmurs, rubs, gallops RESPIRATORY:  Clear to auscultation without rales, wheezing or rhonchi  ABDOMEN: Soft, non-tender, non-distended EXTREMITIES:  No edema; No deformity   ASSESSMENT AND PLAN: .   1.  Hyperlipidemia: [name] presents for further evaluation of her hyperlipidemia.  Her LDL is 231.  She had a coronary calcium score of 0.  She has failed at least several statins.  Will try her on Zetia 10 mg a day.  I would like for her to be seen in the lipid clinic for further evaluation.  She may be a candidate for Repatha or Praluent or possibly  inclisiran.  Will have her follow-up with general cardiology in 6 months.       Dispo: 3 months for labs and lipid clinic,  6 months with  general cardiology    Signed, Kristeen Miss, MD

## 2023-10-17 ENCOUNTER — Encounter: Payer: Self-pay | Admitting: Cardiovascular Disease

## 2023-10-17 ENCOUNTER — Ambulatory Visit: Payer: Medicare Other | Attending: Cardiology | Admitting: Cardiovascular Disease

## 2023-10-17 VITALS — BP 132/88 | HR 93 | Ht 63.0 in | Wt 174.2 lb

## 2023-10-17 DIAGNOSIS — E7800 Pure hypercholesterolemia, unspecified: Secondary | ICD-10-CM | POA: Insufficient documentation

## 2023-10-17 DIAGNOSIS — Z7689 Persons encountering health services in other specified circumstances: Secondary | ICD-10-CM | POA: Diagnosis not present

## 2023-10-17 MED ORDER — EZETIMIBE 10 MG PO TABS: 10.0000 mg | ORAL_TABLET | Freq: Every day | ORAL | 3 refills | Status: DC

## 2023-10-17 NOTE — Patient Instructions (Addendum)
 Medication Instructions:  Your physician has recommended you make the following change in your medication:   START Ezetimibe (Zetia) 10 mg once daily   *If you need a refill on your cardiac medications before your next appointment, please call your pharmacy*  Lab Work: To be completed in 3 months: FASTING lipid panel, ALT, BMP (Approximately January 16, 2024)  If you have labs (blood work) drawn today and your tests are completely normal, you will receive your results only by: MyChart Message (if you have MyChart) OR A paper copy in the mail If you have any lab test that is abnormal or we need to change your treatment, we will call you to review the results.  Testing/Procedures: None ordered today.  Follow-Up: At [facility], you and your health needs are our priority.  As part of our continuing mission to provide you with exceptional heart care, we have created designated Provider Care Teams.  These Care Teams include your primary Cardiologist (physician) and Advanced Practice Providers (APPs -  Physician Assistants and Nurse Practitioners) who all work together to provide you with the care you need, when you need it.  Your next appointment:   3 months with PharmD  6 months with available general cardiologist   The format for your next appointment:   In Person  Provider:   Available Provider {  Other Instructions   1st Floor: - Lobby - Registration  - Pharmacy  - Lab - Cafe  2nd Floor: - PV Lab - Diagnostic Testing (echo, CT, nuclear med)  3rd Floor: - Vacant  4th Floor: - TCTS (cardiothoracic surgery) - AFib Clinic - Structural Heart Clinic - Vascular Surgery  - Vascular Ultrasound  5th Floor: - HeartCare Cardiology (general and EP) - Clinical Pharmacy for coumadin, hypertension, lipid, weight-loss medications, and med management appointments    Valet parking services will be available as well.

## 2024-02-09 ENCOUNTER — Other Ambulatory Visit: Payer: Self-pay

## 2024-02-09 DIAGNOSIS — E7800 Pure hypercholesterolemia, unspecified: Secondary | ICD-10-CM

## 2024-02-09 DIAGNOSIS — Z7689 Persons encountering health services in other specified circumstances: Secondary | ICD-10-CM

## 2024-02-09 MED ORDER — EZETIMIBE 10 MG PO TABS
10.0000 mg | ORAL_TABLET | Freq: Every day | ORAL | 2 refills | Status: AC
  Filled 2024-05-06: qty 90, 90d supply, fill #0
  Filled 2024-08-01: qty 90, 90d supply, fill #1

## 2024-03-25 ENCOUNTER — Other Ambulatory Visit (HOSPITAL_COMMUNITY): Payer: Self-pay

## 2024-03-25 MED ORDER — PANTOPRAZOLE SODIUM 40 MG PO TBEC
40.0000 mg | DELAYED_RELEASE_TABLET | Freq: Every day | ORAL | 2 refills | Status: AC
  Filled 2024-05-06: qty 30, 30d supply, fill #0

## 2024-03-25 MED ORDER — ERGOCALCIFEROL 1.25 MG (50000 UT) PO CAPS
50000.0000 [IU] | ORAL_CAPSULE | ORAL | 4 refills | Status: AC
  Filled 2024-08-01: qty 12, 84d supply, fill #0

## 2024-03-25 MED ORDER — AMLODIPINE BESYLATE 5 MG PO TABS: 5.0000 mg | ORAL_TABLET | Freq: Every day | ORAL | 3 refills | Status: AC

## 2024-03-25 MED ORDER — SERTRALINE HCL 50 MG PO TABS
50.0000 mg | ORAL_TABLET | Freq: Every day | ORAL | 3 refills | Status: DC
  Filled 2024-05-06: qty 90, 90d supply, fill #0

## 2024-03-25 MED ORDER — ERGOCALCIFEROL 1.25 MG (50000 UT) PO CAPS
50000.0000 [IU] | ORAL_CAPSULE | ORAL | 4 refills | Status: AC
  Filled 2024-03-27: qty 12, 84d supply, fill #0

## 2024-03-27 ENCOUNTER — Other Ambulatory Visit (HOSPITAL_COMMUNITY): Payer: Self-pay

## 2024-03-28 ENCOUNTER — Other Ambulatory Visit (HOSPITAL_COMMUNITY): Payer: Self-pay

## 2024-03-29 ENCOUNTER — Other Ambulatory Visit (HOSPITAL_COMMUNITY): Payer: Self-pay

## 2024-04-09 ENCOUNTER — Other Ambulatory Visit (HOSPITAL_COMMUNITY): Payer: Self-pay

## 2024-04-10 ENCOUNTER — Other Ambulatory Visit (HOSPITAL_COMMUNITY): Payer: Self-pay

## 2024-04-10 MED ORDER — ESZOPICLONE 1 MG PO TABS
1.0000 mg | ORAL_TABLET | Freq: Every evening | ORAL | 1 refills | Status: AC
  Filled 2024-04-10: qty 90, 90d supply, fill #0
  Filled 2024-05-06 – 2024-08-01 (×2): qty 90, 90d supply, fill #1

## 2024-05-06 ENCOUNTER — Other Ambulatory Visit: Payer: Self-pay

## 2024-05-06 ENCOUNTER — Other Ambulatory Visit (HOSPITAL_COMMUNITY): Payer: Self-pay

## 2024-05-16 ENCOUNTER — Other Ambulatory Visit (HOSPITAL_COMMUNITY): Payer: Self-pay

## 2024-05-16 MED ORDER — BACITRACIN 500 UNIT/GM EX OINT
TOPICAL_OINTMENT | CUTANEOUS | 0 refills | Status: AC
  Filled 2024-05-16: qty 28, 5d supply, fill #0

## 2024-05-16 MED ORDER — LEVOTHYROXINE SODIUM 125 MCG PO TABS
125.0000 ug | ORAL_TABLET | Freq: Every morning | ORAL | 0 refills | Status: AC
  Filled 2024-05-16: qty 90, 90d supply, fill #0

## 2024-05-16 MED ORDER — CELECOXIB 200 MG PO CAPS
200.0000 mg | ORAL_CAPSULE | Freq: Two times a day (BID) | ORAL | 0 refills | Status: AC
Start: 2024-05-16 — End: ?
  Filled 2024-05-16: qty 28, 14d supply, fill #0

## 2024-05-16 MED ORDER — ACETAMINOPHEN 500 MG PO TABS
1000.0000 mg | ORAL_TABLET | Freq: Four times a day (QID) | ORAL | 0 refills | Status: AC
  Filled 2024-05-16: qty 30, 4d supply, fill #0

## 2024-07-12 ENCOUNTER — Other Ambulatory Visit (HOSPITAL_COMMUNITY): Payer: Self-pay

## 2024-07-12 MED ORDER — LEVOTHYROXINE SODIUM 112 MCG PO TABS
112.0000 ug | ORAL_TABLET | Freq: Every morning | ORAL | 0 refills | Status: DC
  Filled 2024-07-12: qty 90, 90d supply, fill #0

## 2024-08-01 ENCOUNTER — Other Ambulatory Visit (HOSPITAL_COMMUNITY): Payer: Self-pay

## 2024-08-01 ENCOUNTER — Other Ambulatory Visit: Payer: Self-pay

## 2024-08-02 ENCOUNTER — Other Ambulatory Visit (HOSPITAL_COMMUNITY): Payer: Self-pay

## 2024-08-02 ENCOUNTER — Other Ambulatory Visit: Payer: Self-pay

## 2024-08-02 MED ORDER — LEVOTHYROXINE SODIUM 112 MCG PO TABS: 112.0000 ug | ORAL_TABLET | Freq: Every morning | ORAL | 3 refills | Status: AC

## 2024-08-02 MED ORDER — SERTRALINE HCL 50 MG PO TABS
50.0000 mg | ORAL_TABLET | Freq: Every day | ORAL | 3 refills | Status: AC
  Filled 2024-08-02: qty 90, 90d supply, fill #0

## 2024-08-06 ENCOUNTER — Other Ambulatory Visit (HOSPITAL_COMMUNITY): Payer: Self-pay

## 2024-08-08 ENCOUNTER — Other Ambulatory Visit (HOSPITAL_COMMUNITY): Payer: Self-pay
